# Patient Record
Sex: Male | Born: 1960 | Hispanic: Yes | Marital: Single | Smoking: Former smoker
Health system: Southern US, Community
[De-identification: ages and names within clinical notes are randomized; demographics above are authoritative.]

## PROBLEM LIST (undated history)

## (undated) DIAGNOSIS — E119 Type 2 diabetes mellitus without complications: Secondary | ICD-10-CM

## (undated) HISTORY — PX: ABDOMINAL SURGERY: SHX537

---

## 2005-08-19 ENCOUNTER — Emergency Department (HOSPITAL_COMMUNITY): Admission: EM | Admit: 2005-08-19 | Discharge: 2005-08-19 | Payer: Self-pay | Admitting: Emergency Medicine

## 2006-01-08 ENCOUNTER — Emergency Department (HOSPITAL_COMMUNITY): Admission: EM | Admit: 2006-01-08 | Discharge: 2006-01-08 | Payer: Self-pay | Admitting: Emergency Medicine

## 2007-03-26 ENCOUNTER — Emergency Department (HOSPITAL_COMMUNITY): Admission: EM | Admit: 2007-03-26 | Discharge: 2007-03-26 | Payer: Self-pay | Admitting: Emergency Medicine

## 2007-03-28 ENCOUNTER — Emergency Department (HOSPITAL_COMMUNITY): Admission: EM | Admit: 2007-03-28 | Discharge: 2007-03-28 | Payer: Self-pay | Admitting: *Deleted

## 2007-09-15 ENCOUNTER — Emergency Department (HOSPITAL_COMMUNITY): Admission: EM | Admit: 2007-09-15 | Discharge: 2007-09-15 | Payer: Self-pay | Admitting: Emergency Medicine

## 2007-09-25 ENCOUNTER — Emergency Department (HOSPITAL_COMMUNITY): Admission: EM | Admit: 2007-09-25 | Discharge: 2007-09-25 | Payer: Self-pay | Admitting: Emergency Medicine

## 2007-11-23 ENCOUNTER — Ambulatory Visit: Payer: Self-pay | Admitting: Internal Medicine

## 2007-11-23 ENCOUNTER — Encounter (INDEPENDENT_AMBULATORY_CARE_PROVIDER_SITE_OTHER): Payer: Self-pay | Admitting: Nurse Practitioner

## 2007-11-23 LAB — CONVERTED CEMR LAB
ALT: 24 units/L (ref 0–53)
AST: 14 units/L (ref 0–37)
Alkaline Phosphatase: 94 units/L (ref 39–117)
Basophils Absolute: 0 10*3/uL (ref 0.0–0.1)
Basophils Relative: 0 % (ref 0–1)
Creatinine, Ser: 0.95 mg/dL (ref 0.40–1.50)
Eosinophils Relative: 1 % (ref 0–5)
HCT: 52.3 % — ABNORMAL HIGH (ref 39.0–52.0)
Hemoglobin: 17.9 g/dL — ABNORMAL HIGH (ref 13.0–17.0)
Hgb A1c MFr Bld: 12.5 % — ABNORMAL HIGH (ref 4.6–6.1)
LDL Cholesterol: 114 mg/dL — ABNORMAL HIGH (ref 0–99)
MCHC: 34.2 g/dL (ref 30.0–36.0)
Monocytes Absolute: 0.5 10*3/uL (ref 0.1–1.0)
Platelets: 152 10*3/uL (ref 150–400)
RDW: 13 % (ref 11.5–15.5)
TSH: 1.734 microintl units/mL (ref 0.350–5.50)
Total Bilirubin: 1.5 mg/dL — ABNORMAL HIGH (ref 0.3–1.2)
Total CHOL/HDL Ratio: 2.9
VLDL: 19 mg/dL (ref 0–40)

## 2007-11-24 ENCOUNTER — Ambulatory Visit: Payer: Self-pay | Admitting: *Deleted

## 2008-02-28 ENCOUNTER — Ambulatory Visit: Payer: Self-pay | Admitting: Internal Medicine

## 2008-03-07 ENCOUNTER — Ambulatory Visit: Payer: Self-pay | Admitting: Internal Medicine

## 2011-03-17 ENCOUNTER — Emergency Department (HOSPITAL_COMMUNITY)
Admission: EM | Admit: 2011-03-17 | Discharge: 2011-03-18 | Disposition: A | Discharge status: Home / Self Care | Payer: Self-pay | Attending: Emergency Medicine | Admitting: Emergency Medicine

## 2011-03-17 DIAGNOSIS — X58XXXA Exposure to other specified factors, initial encounter: Secondary | ICD-10-CM | POA: Insufficient documentation

## 2011-03-17 DIAGNOSIS — F17200 Nicotine dependence, unspecified, uncomplicated: Secondary | ICD-10-CM | POA: Insufficient documentation

## 2011-03-17 DIAGNOSIS — S61209A Unspecified open wound of unspecified finger without damage to nail, initial encounter: Secondary | ICD-10-CM | POA: Insufficient documentation

## 2011-03-17 DIAGNOSIS — E119 Type 2 diabetes mellitus without complications: Secondary | ICD-10-CM | POA: Insufficient documentation

## 2011-03-17 DIAGNOSIS — Z79899 Other long term (current) drug therapy: Secondary | ICD-10-CM | POA: Insufficient documentation

## 2011-04-23 LAB — URINALYSIS, ROUTINE W REFLEX MICROSCOPIC
Bilirubin Urine: NEGATIVE
Bilirubin Urine: NEGATIVE
Glucose, UA: >1000 — AB
Glucose, UA: >1000 — AB
Ketones, ur: NEGATIVE
Ketones, ur: NEGATIVE
Leukocytes, UA: NEGATIVE
Leukocytes, UA: NEGATIVE
Protein, ur: NEGATIVE
pH: 7
pH: 6

## 2011-04-23 LAB — I-STAT 8, (EC8 V) (CONVERTED LAB)
Bicarbonate: 25.8 — ABNORMAL HIGH
Chloride: 101
Glucose, Bld: 585
Glucose, Bld: 367 — ABNORMAL HIGH
HCT: 50
Hemoglobin: 17
Operator id: 146091
Potassium: 4.3
Potassium: 3.9
Sodium: 135
TCO2: 27
pH, Ven: 7.36 — ABNORMAL HIGH

## 2011-04-23 LAB — POCT I-STAT CREATININE
Creatinine, Ser: 0.9
Operator id: 146091

## 2011-04-23 LAB — DIFFERENTIAL
Basophils Relative: 1
Lymphocytes Relative: 20
Monocytes Absolute: 0.4
Monocytes Relative: 6
Neutro Abs: 4.8

## 2011-04-23 LAB — CBC
Hemoglobin: 17
MCHC: 34.7
RBC: 5.34

## 2011-04-23 LAB — URINE MICROSCOPIC-ADD ON

## 2012-09-17 ENCOUNTER — Encounter (HOSPITAL_COMMUNITY): Payer: Self-pay | Admitting: Emergency Medicine

## 2012-09-17 ENCOUNTER — Emergency Department (HOSPITAL_COMMUNITY)
Admission: EM | Admit: 2012-09-17 | Discharge: 2012-09-17 | Disposition: A | Discharge status: Home / Self Care | Payer: Self-pay | Attending: Emergency Medicine | Admitting: Emergency Medicine

## 2012-09-17 DIAGNOSIS — Z87891 Personal history of nicotine dependence: Secondary | ICD-10-CM | POA: Insufficient documentation

## 2012-09-17 DIAGNOSIS — R739 Hyperglycemia, unspecified: Secondary | ICD-10-CM

## 2012-09-17 DIAGNOSIS — Z79899 Other long term (current) drug therapy: Secondary | ICD-10-CM | POA: Insufficient documentation

## 2012-09-17 DIAGNOSIS — E1169 Type 2 diabetes mellitus with other specified complication: Secondary | ICD-10-CM | POA: Insufficient documentation

## 2012-09-17 DIAGNOSIS — R631 Polydipsia: Secondary | ICD-10-CM | POA: Insufficient documentation

## 2012-09-17 DIAGNOSIS — R358 Other polyuria: Secondary | ICD-10-CM | POA: Insufficient documentation

## 2012-09-17 DIAGNOSIS — R3589 Other polyuria: Secondary | ICD-10-CM | POA: Insufficient documentation

## 2012-09-17 HISTORY — DX: Type 2 diabetes mellitus without complications: E11.9

## 2012-09-17 LAB — CBC WITH DIFFERENTIAL/PLATELET
Basophils Absolute: 0 10*3/uL (ref 0.0–0.1)
Basophils Relative: 0 % (ref 0–1)
Eosinophils Absolute: 0.1 K/uL (ref 0.0–0.7)
Eosinophils Relative: 2 % (ref 0–5)
HCT: 41.5 % (ref 39.0–52.0)
Hemoglobin: 15.1 g/dL (ref 13.0–17.0)
Lymphocytes Relative: 25 % (ref 12–46)
Lymphs Abs: 1.4 K/uL (ref 0.7–4.0)
MCH: 31.6 pg (ref 26.0–34.0)
MCHC: 36.4 g/dL — ABNORMAL HIGH (ref 30.0–36.0)
MCV: 86.8 fL (ref 78.0–100.0)
Monocytes Absolute: 0.4 10*3/uL (ref 0.1–1.0)
Monocytes Relative: 7 % (ref 3–12)
Neutro Abs: 3.6 10*3/uL (ref 1.7–7.7)
Neutrophils Relative %: 66 % (ref 43–77)
Platelets: 121 10*3/uL — ABNORMAL LOW (ref 150–400)
RBC: 4.78 MIL/uL (ref 4.22–5.81)
RDW: 12.8 % (ref 11.5–15.5)
WBC: 5.5 10*3/uL (ref 4.0–10.5)

## 2012-09-17 LAB — POCT I-STAT, CHEM 8
Creatinine, Ser: 0.9 mg/dL (ref 0.50–1.35)
Glucose, Bld: 570 mg/dL (ref 70–99)
HCT: 44 % (ref 39.0–52.0)
Hemoglobin: 15 g/dL (ref 13.0–17.0)
Potassium: 4.2 mEq/L (ref 3.5–5.1)
Sodium: 133 mEq/L — ABNORMAL LOW (ref 135–145)
TCO2: 24 mmol/L (ref 0–100)

## 2012-09-17 LAB — GLUCOSE, CAPILLARY
Glucose-Capillary: 526 mg/dL — ABNORMAL HIGH (ref 70–99)
Glucose-Capillary: 304 mg/dL — ABNORMAL HIGH (ref 70–99)
Glucose-Capillary: 117 mg/dL — ABNORMAL HIGH (ref 70–99)
Glucose-Capillary: 88 mg/dL (ref 70–99)

## 2012-09-17 MED ORDER — INSULIN ASPART 100 UNIT/ML ~~LOC~~ SOLN: 16.0000 [IU] | Freq: Once | SUBCUTANEOUS | Status: DC

## 2012-09-17 MED ORDER — SODIUM CHLORIDE 0.9 % IV BOLUS (SEPSIS)
1000.0000 mL | Freq: Once | INTRAVENOUS | Status: AC
  Administered 2012-09-17: 1000 mL via INTRAVENOUS

## 2012-09-17 MED ORDER — INSULIN ASPART 100 UNIT/ML ~~LOC~~ SOLN
14.0000 [IU] | Freq: Once | SUBCUTANEOUS | Status: AC
  Administered 2012-09-17: 14 [IU] via SUBCUTANEOUS
  Filled 2012-09-17: qty 1

## 2012-09-17 NOTE — ED Notes (Signed)
CBG was 88. 

## 2012-09-17 NOTE — ED Notes (Signed)
Pt states "my blood sugar is high." Pt c/o hunger/thirst. Denies pain.

## 2012-09-17 NOTE — ED Notes (Signed)
Pt given oj, pb and crackers per peter. Pt tolerating well.

## 2012-09-17 NOTE — ED Provider Notes (Signed)
History     CSN: [id number]  Arrival date & time 09/17/12  1836   First MD Initiated Contact with Patient 09/17/12 2014      Chief Complaint  Patient presents with  . Hyperglycemia   HPI   history provided by the patient. Patient is a 52 year old Hispanic male with history of diabetes currently on metformin who presents with complaints of elevated blood sugar. Patient states that he has used to having normal blood sugars as high as 350. He takes 1000 mg of metformin in the morning and at night has been taking this regularly for the past week. Over the past 2 days he is to some increasing blood sugar levels. She also has symptoms of increased thirst and polyuria. He denies any other complaints. Today his blood sugar was above 500 he was concerned and came to the emergency room for further evaluation. Patient normally follows up with the free local clinic but is not recall the office or doctor. He states he at least one week of medications remaining as well as has one refill of his metformin at the pharmacy. He reports that he has plenty of test strips at home. He denies any recent fever, cough or congestion symptoms. Denies any vomiting or diarrhea.    Past Medical History  Diagnosis Date  . Diabetes mellitus without complication     Past Surgical History  Procedure Laterality Date  . Abdominal surgery      No family history on file.  History  Substance Use Topics  . Smoking status: Former Smoker    Quit date: 07/17/2012  . Smokeless tobacco: Not on file  . Alcohol Use: No      Review of Systems  Constitutional: Negative for fever and chills.  Respiratory: Negative for cough and shortness of breath.   Cardiovascular: Negative for chest pain.  Gastrointestinal: Negative for nausea, vomiting, abdominal pain and diarrhea.  Neurological: Negative for dizziness and light-headedness.  All other systems reviewed and are negative.    Allergies  Review of patient's allergies  indicates no known allergies.  Home Medications   Current Outpatient Rx  Name  Route  Sig  Dispense  Refill  . metFORMIN (GLUCOPHAGE) 1000 MG tablet   Oral   Take 1,000 mg by mouth 2 (two) times daily with a meal.           BP 137/78  Pulse 60  Temp(Src) 98 F (36.7 C)  Resp 18  SpO2 97%  Physical Exam  Nursing note and vitals reviewed. Constitutional: He is oriented to person, place, and time. He appears well-developed and well-nourished. No distress.  HENT:  Head: Normocephalic.  Eyes: Conjunctivae and EOM are normal. Pupils are equal, round, and reactive to light.  Cardiovascular: Normal rate and regular rhythm.   No murmur heard. Pulmonary/Chest: Effort normal and breath sounds normal. No respiratory distress. He has no wheezes. He has no rales.  Abdominal: Soft. There is no tenderness. There is no rebound.  Neurological: He is alert and oriented to person, place, and time.  Skin: Skin is warm.  Psychiatric: He has a normal mood and affect. His behavior is normal.    ED Course  Procedures   Results for orders placed during the hospital encounter of 09/17/12  CBC WITH DIFFERENTIAL      Result Value Range   WBC 5.5  4.0 - 10.5 K/uL   RBC 4.78  4.22 - 5.81 MIL/uL   Hemoglobin 15.1  13.0 - 17.0 g/dL  HCT 41.5  39.0 - 52.0 %   MCV 86.8  78.0 - 100.0 fL   MCH 31.6  26.0 - 34.0 pg   MCHC 36.4 (*) 30.0 - 36.0 g/dL   RDW 16.1  09.6 - 04.5 %   Platelets 121 (*) 150 - 400 K/uL   Neutrophils Relative 66  43 - 77 %   Neutro Abs 3.6  1.7 - 7.7 K/uL   Lymphocytes Relative 25  12 - 46 %   Lymphs Abs 1.4  0.7 - 4.0 K/uL   Monocytes Relative 7  3 - 12 %   Monocytes Absolute 0.4  0.1 - 1.0 K/uL   Eosinophils Relative 2  0 - 5 %   Eosinophils Absolute 0.1  0.0 - 0.7 K/uL   Basophils Relative 0  0 - 1 %   Basophils Absolute 0.0  0.0 - 0.1 K/uL  GLUCOSE, CAPILLARY      Result Value Range   Glucose-Capillary 526 (*) 70 - 99 mg/dL   Comment 1 Notify RN    GLUCOSE,  CAPILLARY      Result Value Range   Glucose-Capillary 304 (*) 70 - 99 mg/dL  GLUCOSE, CAPILLARY      Result Value Range   Glucose-Capillary 117 (*) 70 - 99 mg/dL  GLUCOSE, CAPILLARY      Result Value Range   Glucose-Capillary 88  70 - 99 mg/dL  POCT I-STAT, CHEM 8      Result Value Range   Sodium 133 (*) 135 - 145 mEq/L   Potassium 4.2  3.5 - 5.1 mEq/L   Chloride 99  96 - 112 mEq/L   BUN 16  6 - 23 mg/dL   Creatinine, Ser 4.09  0.50 - 1.35 mg/dL   Glucose, Bld 811 (*) 70 - 99 mg/dL   Calcium, Ion 9.14  7.82 - 1.23 mmol/L   TCO2 24  0 - 100 mmol/L   Hemoglobin 15.0  13.0 - 17.0 g/dL   HCT 95.6  21.3 - 08.6 %   Comment NOTIFIED PHYSICIAN    '      1. Hyperglycemia       MDM  8:15 PM patient seen and evaluated. Patient resting comfortably in bed appears in no acute distress.   Blood sugar has improved after subcutaneous insulin and initial IV fluids. Will continue IV fluids and monitor sugar level. No signs of DKA on lab tests.  Blood sugars continues to improve. At this time patient stable for discharge home. We'll have him continue current medication regimen and followup with PCP for further evaluation.      Kevin Hubbard, [place] 09/18/12 [phone]

## 2012-09-17 NOTE — ED Notes (Addendum)
Pt checked Blood sugar this morning it was 352. Pt ate fruit all day today and had [employer] this evening and re-checked blood sugar read 510. Pt took 1 metformin 1000mg  tab an hour PTA. Pt reports increase thirst and urination.

## 2012-09-17 NOTE — ED Notes (Signed)
CBG was 117 

## 2012-09-20 NOTE — ED Provider Notes (Signed)
Medical screening examination/treatment/procedure(s) were performed by non-physician practitioner and as supervising physician I was immediately available for consultation/collaboration.   Gavin Pound. Teya Otterson, MD 09/20/12 1126

## 2013-07-25 ENCOUNTER — Emergency Department (HOSPITAL_COMMUNITY)
Admission: EM | Admit: 2013-07-25 | Discharge: 2013-07-25 | Disposition: A | Discharge status: Home / Self Care | Payer: Self-pay | Attending: Emergency Medicine | Admitting: Emergency Medicine

## 2013-07-25 ENCOUNTER — Encounter (HOSPITAL_COMMUNITY): Payer: Self-pay | Admitting: Emergency Medicine

## 2013-07-25 DIAGNOSIS — Z760 Encounter for issue of repeat prescription: Secondary | ICD-10-CM | POA: Insufficient documentation

## 2013-07-25 DIAGNOSIS — R42 Dizziness and giddiness: Secondary | ICD-10-CM | POA: Insufficient documentation

## 2013-07-25 DIAGNOSIS — E119 Type 2 diabetes mellitus without complications: Secondary | ICD-10-CM | POA: Insufficient documentation

## 2013-07-25 DIAGNOSIS — Z87891 Personal history of nicotine dependence: Secondary | ICD-10-CM | POA: Insufficient documentation

## 2013-07-25 DIAGNOSIS — R739 Hyperglycemia, unspecified: Secondary | ICD-10-CM

## 2013-07-25 DIAGNOSIS — R350 Frequency of micturition: Secondary | ICD-10-CM | POA: Insufficient documentation

## 2013-07-25 LAB — URINE MICROSCOPIC-ADD ON

## 2013-07-25 LAB — POCT I-STAT, CHEM 8
BUN: 16 mg/dL (ref 6–23)
Chloride: 101 mEq/L (ref 96–112)
HCT: 44 % (ref 39.0–52.0)
Potassium: 3.8 mEq/L (ref 3.5–5.1)

## 2013-07-25 LAB — URINALYSIS, ROUTINE W REFLEX MICROSCOPIC
Glucose, UA: >1000 mg/dL — AB
Leukocytes, UA: NEGATIVE
Nitrite: NEGATIVE
Specific Gravity, Urine: 1.036 — ABNORMAL HIGH (ref 1.005–1.030)
pH: 6 (ref 5.0–8.0)

## 2013-07-25 LAB — GLUCOSE, CAPILLARY

## 2013-07-25 MED ORDER — METFORMIN HCL 1000 MG PO TABS: 1000.0000 mg | ORAL_TABLET | Freq: Two times a day (BID) | ORAL | Status: DC

## 2013-07-25 MED ORDER — GLIPIZIDE 10 MG PO TABS: 10.0000 mg | ORAL_TABLET | Freq: Every day | ORAL | Status: DC

## 2013-07-25 NOTE — ED Notes (Signed)
Pt here to get refill on medication; stated that he feels fine just knows that he needs to get medication filled or his blood sugar will rise

## 2013-07-25 NOTE — ED Notes (Addendum)
Reports being out of diabetes medications x 1 month. cbg 300

## 2013-07-25 NOTE — ED Provider Notes (Signed)
Medical screening examination/treatment/procedure(s) were performed by non-physician practitioner and as supervising physician I was immediately available for consultation/collaboration.  EKG Interpretation   None         Derwood Kaplan, MD 07/25/13 2126

## 2013-07-25 NOTE — ED Provider Notes (Signed)
CSN: [id number]     Arrival date & time 07/25/13  1109 History   None    Chief Complaint  Patient presents with  . Medication Refill    HPI  Kevin Hubbard is a 52 y.o. male with a PMH of DM who presents to the ED for evaluation of medication refill.  History was provided by the patient.  Patient states that he has been out of his diabetes medications x 1 month.  He states that he has put in requests for refill by his PCP Dr. Kris Hartmann however "he must be out of town" because he has not heard back from them.  He takes glipizide 10 mg once daily and Metformin 1000 mg twice daily which controls his sugars in the 120-150's.  He states that since he has been off of his medications his blood sugar has been in the 300-450's.  He is otherwise asymptomatic.  He notices that he gets a little dizzy and has increased urination when his sugars are high, but once they do down he feels fine.  He denies any fevers, chest pain, SOB, emesis, diarrhea, headache, weakness, or abdominal pain.      Past Medical History  Diagnosis Date  . Diabetes mellitus without complication    Past Surgical History  Procedure Laterality Date  . Abdominal surgery     History reviewed. No pertinent family history. History  Substance Use Topics  . Smoking status: Former Smoker    Quit date: 07/17/2012  . Smokeless tobacco: Not on file  . Alcohol Use: No    Review of Systems  Constitutional: Negative for fever, chills, diaphoresis, activity change, appetite change and fatigue.  Respiratory: Negative for cough and shortness of breath.   Cardiovascular: Negative for chest pain and leg swelling.  Gastrointestinal: Negative for nausea, vomiting, abdominal pain and diarrhea.  Endocrine: Positive for polyuria (intermittent).  Genitourinary: Negative for dysuria.  Musculoskeletal: Negative for back pain, myalgias and neck pain.  Skin: Negative for color change and wound.  Neurological: Positive for dizziness (intermittent).  Negative for syncope, weakness, light-headedness, numbness and headaches.  Psychiatric/Behavioral: Negative for confusion.    Allergies  Review of patient's allergies indicates no known allergies.  Home Medications   Current Outpatient Rx  Name  Route  Sig  Dispense  Refill  . metFORMIN (GLUCOPHAGE) 1000 MG tablet   Oral   Take 1,000 mg by mouth 2 (two) times daily with a meal.          BP 129/84  Pulse 67  Temp(Src) 97.6 F (36.4 C) (Oral)  Resp 16  Ht 5\' 3"  (1.6 m)  Wt 152 lb 4 oz (69.06 kg)  BMI 26.98 kg/m2  SpO2 100%  Filed Vitals:   07/25/13 1149 07/25/13 1200 07/25/13 1215 07/25/13 1230  BP: 110/57 109/62 157/70 109/64  Pulse: 69 61 71 59  Temp:      TempSrc:      Resp: 18     Height:      Weight:      SpO2: 97% 96% 95% 96%    Physical Exam  Nursing note and vitals reviewed. Constitutional: He is oriented to person, place, and time. He appears well-developed and well-nourished. No distress.  HENT:  Head: Normocephalic and atraumatic.  Right Ear: External ear normal.  Left Ear: External ear normal.  Nose: Nose normal.  Mouth/Throat: Oropharynx is clear and moist.  Eyes: Conjunctivae are normal. Pupils are equal, round, and reactive to light. Right eye exhibits no discharge.  Left eye exhibits no discharge.  Neck: Normal range of motion. Neck supple.  Cardiovascular: Normal rate, regular rhythm, normal heart sounds and intact distal pulses.  Exam reveals no gallop and no friction rub.   No murmur heard. Dorsalis pedis pulses present and equal bilaterally.  Pulmonary/Chest: Effort normal and breath sounds normal. No respiratory distress. He has no wheezes. He has no rales. He exhibits no tenderness.  Abdominal: Soft. He exhibits no distension and no mass. There is no tenderness. There is no rebound and no guarding.  Vertical surgical scar   Musculoskeletal: Normal range of motion. He exhibits no edema and no tenderness.  No pedal edema or calf tenderness  bilaterally.  Neurological: He is alert and oriented to person, place, and time.  Skin: Skin is warm and dry. He is not diaphoretic.    ED Course  Procedures (including critical care time) Labs Review Labs Reviewed  GLUCOSE, CAPILLARY - Abnormal; Notable for the following:    Glucose-Capillary 300 (*)    All other components within normal limits   Imaging Review No results found.  EKG Interpretation   None      Results for orders placed during the hospital encounter of 07/25/13  GLUCOSE, CAPILLARY      Result Value Range   Glucose-Capillary 300 (*) 70 - 99 mg/dL   Comment 1 Notify RN     Results for orders placed during the hospital encounter of 07/25/13  GLUCOSE, CAPILLARY      Result Value Range   Glucose-Capillary 300 (*) 70 - 99 mg/dL   Comment 1 Notify RN    URINALYSIS, ROUTINE W REFLEX MICROSCOPIC      Result Value Range   Color, Urine YELLOW  YELLOW   APPearance CLEAR  CLEAR   Specific Gravity, Urine 1.036 (*) 1.005 - 1.030   pH 6.0  5.0 - 8.0   Glucose, UA >1000 (*) NEGATIVE mg/dL   Hgb urine dipstick NEGATIVE  NEGATIVE   Bilirubin Urine NEGATIVE  NEGATIVE   Ketones, ur NEGATIVE  NEGATIVE mg/dL   Protein, ur NEGATIVE  NEGATIVE mg/dL   Urobilinogen, UA 1.0  0.0 - 1.0 mg/dL   Nitrite NEGATIVE  NEGATIVE   Leukocytes, UA NEGATIVE  NEGATIVE  URINE MICROSCOPIC-ADD ON      Result Value Range   Squamous Epithelial / LPF RARE  RARE   WBC, UA 0-2  <3 WBC/hpf  POCT I-STAT, CHEM 8      Result Value Range   Sodium 139  135 - 145 mEq/L   Potassium 3.8  3.5 - 5.1 mEq/L   Chloride 101  96 - 112 mEq/L   BUN 16  6 - 23 mg/dL   Creatinine, Ser 0.45  0.50 - 1.35 mg/dL   Glucose, Bld 409 (*) 70 - 99 mg/dL   Calcium, Ion 8.11 (*) 1.12 - 1.23 mmol/L   TCO2 24  0 - 100 mmol/L   Hemoglobin 15.0  13.0 - 17.0 g/dL   HCT 91.4  78.2 - 95.6 %    MDM   Kevin Hubbard is a 52 y.o. male with a PMH of DM who presents to the ED for evaluation of medication refill.    Rechecks   1:40 PM = Patient resting comfortably.  No complaints.     Patient in ED for medication refill of his metformin and glipizide.  Has been out of medications x 1 month.  AG 14.  No ketones in the urine.  No UTI.  Benign exam.  Patient asymptomatic.  Patient's medications were refilled.  Patient encouraged to follow-up with his PCP for further evaluation and management.  Return precautions, discharge instructions, and follow-up was discussed with the patient before discharge.      New Prescriptions   GLIPIZIDE (GLUCOTROL) 10 MG TABLET    Take 1 tablet (10 mg total) by mouth daily before breakfast.   METFORMIN (GLUCOPHAGE) 1000 MG TABLET    Take 1 tablet (1,000 mg total) by mouth 2 (two) times daily with a meal.    Final impressions: 1. Encounter for medication refill   2. Hyperglycemia       Luiz Iron PA-C   This patient was discussed with Dr. Verne Carrow, PA-C 07/25/13 1347

## 2014-03-09 ENCOUNTER — Emergency Department (HOSPITAL_COMMUNITY)
Admission: EM | Admit: 2014-03-09 | Discharge: 2014-03-09 | Disposition: A | Discharge status: Home / Self Care | Payer: Self-pay | Attending: Emergency Medicine | Admitting: Emergency Medicine

## 2014-03-09 ENCOUNTER — Encounter (HOSPITAL_COMMUNITY): Payer: Self-pay | Admitting: Emergency Medicine

## 2014-03-09 DIAGNOSIS — M79609 Pain in unspecified limb: Secondary | ICD-10-CM

## 2014-03-09 DIAGNOSIS — M79604 Pain in right leg: Secondary | ICD-10-CM

## 2014-03-09 DIAGNOSIS — Z87891 Personal history of nicotine dependence: Secondary | ICD-10-CM | POA: Insufficient documentation

## 2014-03-09 DIAGNOSIS — E119 Type 2 diabetes mellitus without complications: Secondary | ICD-10-CM | POA: Insufficient documentation

## 2014-03-09 DIAGNOSIS — Z79899 Other long term (current) drug therapy: Secondary | ICD-10-CM | POA: Insufficient documentation

## 2014-03-09 DIAGNOSIS — M549 Dorsalgia, unspecified: Secondary | ICD-10-CM | POA: Insufficient documentation

## 2014-03-09 LAB — CBG MONITORING, ED: GLUCOSE-CAPILLARY: 258 mg/dL — AB (ref 70–99)

## 2014-03-09 MED ORDER — METFORMIN HCL 1000 MG PO TABS: 1000.0000 mg | ORAL_TABLET | Freq: Two times a day (BID) | ORAL | Status: DC

## 2014-03-09 NOTE — ED Notes (Signed)
PA Harris at bedside  

## 2014-03-09 NOTE — Progress Notes (Signed)
*  PRELIMINARY RESULTS* Vascular Ultrasound Right lower extremity venous duplex has been completed.  Preliminary findings: No evidence of DVT, superficial thrombosis, or baker's cyst.  Farrel DemarkJill Eunice, RDMS, RVT  03/09/2014, 1:54 PM

## 2014-03-09 NOTE — ED Notes (Signed)
Rt. Leg pain began yesterday denies any injury, spasm like pain shoots down his thigh into the foot. No swelling or deformity noted. +pedal pulse +CNS

## 2014-03-09 NOTE — ED Provider Notes (Signed)
CSN: [id number]     Arrival date & time 03/09/14  4098 History   First MD Initiated Contact with Patient 03/09/14 [phone]     Chief Complaint  Patient presents with  . Leg Pain     (Consider location/radiation/quality/duration/timing/severity/associated sxs/prior Treatment) HPI Comments: Kevin Hubbard This 53 year old Spanish-speaking male who presents with complaint of right leg pain. Patient states it began yesterday morning. He describes the pain as aching, from his right knee down to his right foot. He states that he thought that his leg was full of blood. He rested yesterday and his leg above his heart which seemed to help some. He states it in the middle of the night the pain was so severe that it woke him from sleep. He did not take anything for the pain. He denies any radiation, numbness, tingling. Patient states that he is a diabetic and he was concerned there was a problem with his leg. He denies a history of neuropathic symptoms.  Patient is a 53 y.o. male presenting with leg pain. The history is provided by the patient. The history is limited by a language barrier. A language interpreter was used.  Leg Pain Location:  Leg Time since incident:  1 day Injury: no   Leg location:  R leg Pain details:    Quality:  Aching   Radiates to:  Does not radiate   Pain severity now: no pain currently.   Onset quality:  Gradual   Duration:  24 hours   Timing:  Constant   Progression:  Resolved Chronicity:  New Prior injury to area:  No Relieved by:  None tried Worsened by:  Nothing tried Associated symptoms: back pain   Associated symptoms: no decreased ROM, no fatigue, no fever, no itching, no muscle weakness, no neck pain, no numbness, no stiffness and no swelling     Past Medical History  Diagnosis Date  . Diabetes mellitus without complication    Past Surgical History  Procedure Laterality Date  . Abdominal surgery     No family history on file. History  Substance Use  Topics  . Smoking status: Former Smoker    Quit date: 07/17/2012  . Smokeless tobacco: Not on file  . Alcohol Use: No    Review of Systems  Constitutional: Negative for fever, chills and fatigue.  Respiratory: Negative for cough and shortness of breath.   Cardiovascular: Negative for chest pain and palpitations.  Gastrointestinal: Negative for vomiting, abdominal pain, diarrhea and constipation.  Genitourinary: Negative for dysuria, urgency and frequency.  Musculoskeletal: Positive for back pain. Negative for arthralgias, myalgias, neck pain and stiffness.  Skin: Negative for itching and rash.  Neurological: Negative for weakness and numbness.  All other systems reviewed and are negative.     Allergies  Review of patient's allergies indicates no known allergies.  Home Medications   Prior to Admission medications   Medication Sig Start Date End Date Taking? Authorizing Provider  metFORMIN (GLUCOPHAGE) 1000 MG tablet Take 1,000 mg by mouth 2 (two) times daily with a meal.   Yes Historical Provider, MD   BP 134/75  Pulse 70  Temp(Src) 97.7 F (36.5 C) (Oral)  Resp 18  Ht 5\' 3"  (1.6 m)  Wt 145 lb (65.772 kg)  BMI 25.69 kg/m2  SpO2 99% Physical Exam  Nursing note and vitals reviewed. Constitutional: He appears well-developed and well-nourished. No distress.  HENT:  Head: Normocephalic and atraumatic.  Eyes: Conjunctivae are normal. No scleral icterus.  Neck: Normal range of  motion. Neck supple.  Cardiovascular: Normal rate, regular rhythm, normal heart sounds and intact distal pulses.   Pulmonary/Chest: Effort normal and breath sounds normal. No respiratory distress.  Abdominal: Soft. There is no tenderness.  Musculoskeletal: He exhibits no edema.  R leg with good distal pulses  Neurological: He is alert.  Skin: Skin is warm and dry. He is not diaphoretic.  Psychiatric: His behavior is normal.    ED Course  Procedures (including critical care time) Labs  Review Labs Reviewed - No data to display  Imaging Review No results found.   EKG Interpretation None      MDM   Final diagnoses:  Right leg pain    Patient with R leg pain. Will evaluate for DVT. Pulses present. nv intact.     Duplex negative. Will d/c patient. Refilled his diabetes meds. otc meds for pain relief. Discussed return precautions. Pain is resolved currently.    Arthor CaptainAbigail Barron Vanloan, PA-C 03/09/14 2227

## 2014-03-09 NOTE — ED Notes (Signed)
Pt states R lower leg pain shooting to foot. Onset yesterday. 5/10 pain at the time. Pedal pulses present. Sensation intact. No obvious deformity noted. Pt is alert and oriented x4. Ambulatory to exam room.

## 2014-03-09 NOTE — Discharge Instructions (Signed)
Your ultrasound showed no evidence of a clot or other problem with circulation.  You may have a problem with your nerve known as sciatica. Please take over the counter pain medication such as tylenol or advil as needed. Sciatica Sciatica is pain, weakness, numbness, or tingling along the path of the sciatic nerve. The nerve starts in the lower back and runs down the back of each leg. The nerve controls the muscles in the lower leg and in the back of the knee, while also providing sensation to the back of the thigh, lower leg, and the sole of your foot. Sciatica is a symptom of another medical condition. For instance, nerve damage or certain conditions, such as a herniated disk or bone spur on the spine, pinch or put pressure on the sciatic nerve. This causes the pain, weakness, or other sensations normally associated with sciatica. Generally, sciatica only affects one side of the body. CAUSES   Herniated or slipped disc.  Degenerative disk disease.  A pain disorder involving the narrow muscle in the buttocks (piriformis syndrome).  Pelvic injury or fracture.  Pregnancy.  Tumor (rare). SYMPTOMS  Symptoms can vary from mild to very severe. The symptoms usually travel from the low back to the buttocks and down the back of the leg. Symptoms can include:  Mild tingling or dull aches in the lower back, leg, or hip.  Numbness in the back of the calf or sole of the foot.  Burning sensations in the lower back, leg, or hip.  Sharp pains in the lower back, leg, or hip.  Leg weakness.  Severe back pain inhibiting movement. These symptoms may get worse with coughing, sneezing, laughing, or prolonged sitting or standing. Also, being overweight may worsen symptoms. DIAGNOSIS  Your caregiver will perform a physical exam to look for common symptoms of sciatica. He or she may ask you to do certain movements or activities that would trigger sciatic nerve pain. Other tests may be performed to find the  cause of the sciatica. These may include:  Blood tests.  X-rays.  Imaging tests, such as an MRI or CT scan. TREATMENT  Treatment is directed at the cause of the sciatic pain. Sometimes, treatment is not necessary and the pain and discomfort goes away on its own. If treatment is needed, your caregiver may suggest:  Over-the-counter medicines to relieve pain.  Prescription medicines, such as anti-inflammatory medicine, muscle relaxants, or narcotics.  Applying heat or ice to the painful area.  Steroid injections to lessen pain, irritation, and inflammation around the nerve.  Reducing activity during periods of pain.  Exercising and stretching to strengthen your abdomen and improve flexibility of your spine. Your caregiver may suggest losing weight if the extra weight makes the back pain worse.  Physical therapy.  Surgery to eliminate what is pressing or pinching the nerve, such as a bone spur or part of a herniated disk. HOME CARE INSTRUCTIONS   Only take over-the-counter or prescription medicines for pain or discomfort as directed by your caregiver.  Apply ice to the affected area for 20 minutes, 3-4 times a day for the first 48-72 hours. Then try heat in the same way.  Exercise, stretch, or perform your usual activities if these do not aggravate your pain.  Attend physical therapy sessions as directed by your caregiver.  Keep all follow-up appointments as directed by your caregiver.  Do not wear high heels or shoes that do not provide proper support.  Check your mattress to see if it  is too soft. A firm mattress may lessen your pain and discomfort. SEEK IMMEDIATE MEDICAL CARE IF:   You lose control of your bowel or bladder (incontinence).  You have increasing weakness in the lower back, pelvis, buttocks, or legs.  You have redness or swelling of your back.  You have a burning sensation when you urinate.  You have pain that gets worse when you lie down or awakens you  at night.  Your pain is worse than you have experienced in the past.  Your pain is lasting longer than 4 weeks.  You are suddenly losing weight without reason. MAKE SURE YOU:  Understand these instructions.  Will watch your condition.  Will get help right away if you are not doing well or get worse. Document Released: 07/13/2001 Document Revised: 01/18/2012 Document Reviewed: 11/28/2011 [facility] Patient Information 2015 [employer] [place]. This information is not intended to replace advice given to you by your health care provider. Make sure you discuss any questions you have with your health care provider.

## 2014-03-09 NOTE — Progress Notes (Signed)
ED CM spoke with patient at bedside. [facility] c/o [facility] pain, patient also noted to have elevated BS 258 on ED eval. Pt reports not having a PCP or health coverage. [facility] and their services for establishing care,  patient is agreeable. [facility]ided [facility] with address,phone and directions to clinic. Offered to assist with scheduling walk in appt. For Tuesday 8/11 patient agreeable and appreciative of assistance. Pt verbalized understanding,teach back done.  Updated with Dr. Jodi MourningZavitz and Aundra MilletMegan RN on [facility]. No further CM needs identified.

## 2014-03-09 NOTE — ED Notes (Signed)
Report received from [place]egan, [place]-- pt moved to Rm C28

## 2014-03-09 NOTE — ED Notes (Signed)
Pt moved to POD C Room 28, to have doppler study performed, pt aware of delay. No signs of distress noted. Pt remains alert and oriented x4. NAD.

## 2014-03-10 NOTE — ED Provider Notes (Signed)
Medical screening examination/treatment/procedure(s) were conducted as a shared visit with non-physician practitioner(s) or resident and myself. I personally evaluated the patient during the encounter and agree with the findings.  I have personally reviewed any xrays and/ or EKG's with the provider and I agree with interpretation.  Patient with right posterior leg pain worse in the calf she is down right leg, no injuries or history of similar. No back pain. On exam patient has 2+ distal pulses good cap refill, mild tenderness posterior knee and calf, no obvious swelling, sensation intact. Patient denies shortness of breath or chest pain. Plan for ultrasound to look for blood clot or at other differential includes musculoskeletal, sciatica. Patient has good strength lower extremities and good sensation lower extremity.   Right leg pain  Enid SkeensJoshua M Raekwan Spelman, MD 03/10/14 [phone]

## 2015-02-09 ENCOUNTER — Emergency Department (HOSPITAL_COMMUNITY)
Admission: EM | Admit: 2015-02-09 | Discharge: 2015-02-09 | Disposition: A | Discharge status: Home / Self Care | Payer: Self-pay | Attending: Emergency Medicine | Admitting: Emergency Medicine

## 2015-02-09 ENCOUNTER — Encounter (HOSPITAL_COMMUNITY): Payer: Self-pay | Admitting: Emergency Medicine

## 2015-02-09 DIAGNOSIS — Y990 Civilian activity done for income or pay: Secondary | ICD-10-CM | POA: Insufficient documentation

## 2015-02-09 DIAGNOSIS — Z79899 Other long term (current) drug therapy: Secondary | ICD-10-CM | POA: Insufficient documentation

## 2015-02-09 DIAGNOSIS — T148XXA Other injury of unspecified body region, initial encounter: Secondary | ICD-10-CM

## 2015-02-09 DIAGNOSIS — Y929 Unspecified place or not applicable: Secondary | ICD-10-CM | POA: Insufficient documentation

## 2015-02-09 DIAGNOSIS — Z87891 Personal history of nicotine dependence: Secondary | ICD-10-CM | POA: Insufficient documentation

## 2015-02-09 DIAGNOSIS — S80812A Abrasion, left lower leg, initial encounter: Secondary | ICD-10-CM | POA: Insufficient documentation

## 2015-02-09 DIAGNOSIS — E119 Type 2 diabetes mellitus without complications: Secondary | ICD-10-CM | POA: Insufficient documentation

## 2015-02-09 DIAGNOSIS — W1839XA Other fall on same level, initial encounter: Secondary | ICD-10-CM | POA: Insufficient documentation

## 2015-02-09 DIAGNOSIS — Y939 Activity, unspecified: Secondary | ICD-10-CM | POA: Insufficient documentation

## 2015-02-09 MED ORDER — BACITRACIN ZINC 500 UNIT/GM EX OINT: 1.0000 "application " | TOPICAL_OINTMENT | Freq: Two times a day (BID) | CUTANEOUS | Status: DC

## 2015-02-09 NOTE — ED Notes (Signed)
The patient said he fell at work and he got an abrasion on his left shin.  Bleeding is controlled.  This happened on Friday but since he is a diabetic he is worried it might get infected.   Patient rates his 5/10.

## 2015-02-09 NOTE — ED Notes (Signed)
Pt stable, ambulatory, states understanding of discharge instructions 

## 2015-02-09 NOTE — ED Provider Notes (Signed)
CSN: [id number]     Arrival date & time 02/09/15  2031 History  This chart was scribed for Roxy Horseman, PA-C, working with Lorre Nick, MD by Elon Spanner, ED Scribe. This patient was seen in room TR08C/TR08C and the patient's care was started at 9:23 PM.   Chief Complaint  Patient presents with  . Abrasion    The patient said he fell at work and he got an abrasion on his left shin.  Bleeding is controlled.  This happened on Friday but since he is a diabetic he is worried it might get infected.    The history is provided by the patient. No language interpreter was used.   HPI Comments: Kevin Hubbard is a 54 y.o. male with a history of DM who presents to the Emergency Department complaining of a non-painful abrasion on the left leg onset 2 days ago.  The patient has kept the wound clean and used neosporin.  He reports his primary motivator for coming to the ED is concern of infection to his DM.  Tetanus UTD.   Past Medical History  Diagnosis Date  . Diabetes mellitus without complication    Past Surgical History  Procedure Laterality Date  . Abdominal surgery     History reviewed. No pertinent family history. History  Substance Use Topics  . Smoking status: Former Smoker    Quit date: 07/17/2012  . Smokeless tobacco: Not on file  . Alcohol Use: No    Review of Systems  Constitutional: Negative for fever and chills.  Respiratory: Negative for shortness of breath.   Cardiovascular: Negative for chest pain.  Gastrointestinal: Negative for nausea, vomiting, diarrhea and constipation.  Genitourinary: Negative for dysuria.  Skin: Positive for wound.      Allergies  Review of patient's allergies indicates no known allergies.  Home Medications   Prior to Admission medications   Medication Sig Start Date End Date Taking? Authorizing Provider  metFORMIN (GLUCOPHAGE) 1000 MG tablet Take 1 tablet (1,000 mg total) by mouth 2 (two) times daily with a meal. 03/09/14   Abigail  Harris, PA-C   BP 120/67 mmHg  Pulse 58  Temp(Src) 98 F (36.7 C) (Oral)  Resp 18  SpO2 100% Physical Exam  Constitutional: He is oriented to person, place, and time. He appears well-developed and well-nourished. No distress.  HENT:  Head: Normocephalic and atraumatic.  Eyes: Conjunctivae and EOM are normal.  Neck: Neck supple. No tracheal deviation present.  Cardiovascular: Normal rate.   Pulmonary/Chest: Effort normal. No respiratory distress.  Musculoskeletal: Normal range of motion.  Neurological: He is alert and oriented to person, place, and time.  Skin: Skin is warm and dry.  1 x 2 cm abrasion, no discharge, no drainage, no abscess, no cellulitis  Psychiatric: He has a normal mood and affect. His behavior is normal.  Nursing note and vitals reviewed.   ED Course  Procedures (including critical care time)  DIAGNOSTIC STUDIES: Oxygen Saturation is 100% on RA, normal by my interpretation.    COORDINATION OF CARE:  9:24 PM Informed patient that wound appears to be healing well and should be cleaned daily with soap and warm water.  Will prescribe bacitracin.  Patient acknowledges and agrees with plan.    Labs Review Labs Reviewed - No data to display  Imaging Review No results found.   EKG Interpretation None      MDM   Final diagnoses:  Abrasion    Patient with abrasion to left shin, the abrasion/laceration is  too old to repair. There is no evidence of infection. No cellulitis or abscess. Tetanus shot is up-to-date. Will treat with bacitracin and wound care precautions. Patient understands and agrees with the plan. He is stable and ready for discharge.  I personally performed the services described in this documentation, which was scribed in my presence. The recorded information has been reviewed and is accurate.     Roxy HorsemanRobert Belanna Manring, PA-C 02/09/15 2147  Lorre NickAnthony Allen, MD 02/09/15 [phone]

## 2015-02-09 NOTE — ED Notes (Signed)
Wound cleaned and bactrim placed on wound. Covered with 2x2 and tape.

## 2015-02-09 NOTE — Discharge Instructions (Signed)

## 2015-02-18 ENCOUNTER — Emergency Department (HOSPITAL_COMMUNITY)
Admission: EM | Admit: 2015-02-18 | Discharge: 2015-02-18 | Disposition: A | Discharge status: Home / Self Care | Payer: Self-pay | Attending: Emergency Medicine | Admitting: Emergency Medicine

## 2015-02-18 ENCOUNTER — Encounter (HOSPITAL_COMMUNITY): Payer: Self-pay | Admitting: Neurology

## 2015-02-18 DIAGNOSIS — Z87891 Personal history of nicotine dependence: Secondary | ICD-10-CM | POA: Insufficient documentation

## 2015-02-18 DIAGNOSIS — S81812S Laceration without foreign body, left lower leg, sequela: Secondary | ICD-10-CM | POA: Insufficient documentation

## 2015-02-18 DIAGNOSIS — S81802S Unspecified open wound, left lower leg, sequela: Secondary | ICD-10-CM

## 2015-02-18 DIAGNOSIS — Z79899 Other long term (current) drug therapy: Secondary | ICD-10-CM | POA: Insufficient documentation

## 2015-02-18 DIAGNOSIS — Z23 Encounter for immunization: Secondary | ICD-10-CM | POA: Insufficient documentation

## 2015-02-18 DIAGNOSIS — E119 Type 2 diabetes mellitus without complications: Secondary | ICD-10-CM | POA: Insufficient documentation

## 2015-02-18 DIAGNOSIS — W2203XS Walked into furniture, sequela: Secondary | ICD-10-CM | POA: Insufficient documentation

## 2015-02-18 MED ORDER — CEPHALEXIN 500 MG PO CAPS: 1000.0000 mg | ORAL_CAPSULE | Freq: Two times a day (BID) | ORAL | Status: DC

## 2015-02-18 MED ORDER — SULFAMETHOXAZOLE-TRIMETHOPRIM 800-160 MG PO TABS: 1.0000 | ORAL_TABLET | Freq: Two times a day (BID) | ORAL | Status: AC

## 2015-02-18 MED ORDER — TETANUS-DIPHTH-ACELL PERTUSSIS 5-2.5-18.5 LF-MCG/0.5 IM SUSP
0.5000 mL | Freq: Once | INTRAMUSCULAR | Status: AC
Start: 2015-02-18 — End: 2015-02-18
  Administered 2015-02-18: 0.5 mL via INTRAMUSCULAR
  Filled 2015-02-18: qty 0.5

## 2015-02-18 NOTE — ED Provider Notes (Signed)
CSN: [id number]     Arrival date & time 02/18/15  1421 History  This chart was scribed for Fayrene Helper, PA-C, working with Toy Cookey, MD by Octavia Heir, ED Scribe. This patient was seen in room TR05C/TR05C and the patient's care was started at 3:17 PM.    Chief Complaint  Patient presents with  . Wound Check      The history is provided by the patient. No language interpreter was used.   HPI Comments: Kevin Hubbard is a 54 y.o. male who has a hx of DM presents to the Emergency Department complaining of a wound on his left shin onset 2 weeks ago. Pt works in [employer] and cut his leg with a piece of wood causing a laceration. He notes occasional pain around the area. Pt notes 2 days ago, his leg was swollen from his leg down to his ankle. Pt was seen last week in the ED, evaluated and was sent home with no medication. He states using neosporin for the wound and has been changing the bandages regularly. He denies fever. He is unknown of his last tetanus shot. He is concern due to hx of DM and not receiving any treatment.    Past Medical History  Diagnosis Date  . Diabetes mellitus without complication    Past Surgical History  Procedure Laterality Date  . Abdominal surgery     No family history on file. History  Substance Use Topics  . Smoking status: Former Smoker    Quit date: 07/17/2012  . Smokeless tobacco: Not on file  . Alcohol Use: No    Review of Systems  Constitutional: Negative for fever.  Skin: Positive for wound.      Allergies  Review of patient's allergies indicates no known allergies.  Home Medications   Prior to Admission medications   Medication Sig Start Date End Date Taking? Authorizing Provider  bacitracin ointment Apply 1 application topically 2 (two) times daily. 02/09/15   Roxy Horseman, PA-C  metFORMIN (GLUCOPHAGE) 1000 MG tablet Take 1 tablet (1,000 mg total) by mouth 2 (two) times daily with a meal. 03/09/14   Arthor Captain, PA-C    Triage vitals: BP 123/70 mmHg  Pulse 72  Temp(Src) 97.8 F (36.6 C) (Oral)  Resp 16  SpO2 96% Physical Exam  Constitutional: He appears well-developed and well-nourished. No distress.  HENT:  Head: Normocephalic and atraumatic.  Eyes: Conjunctivae are normal. Pupils are equal, round, and reactive to light. Right eye exhibits no discharge. Left eye exhibits no discharge.  Neck: Neck supple.  Cardiovascular: Normal rate, regular rhythm, normal heart sounds and intact distal pulses.   Pulmonary/Chest: Effort normal and breath sounds normal. No respiratory distress.  Abdominal: Soft. There is no tenderness.  Lymphadenopathy:    He has no cervical adenopathy.  Neurological: He is alert. Coordination normal.  Skin: Skin is warm and dry. No rash noted. He is not diaphoretic.  Quarter sized partial thickness ulceration to left anterior tib-fib with granular tissue and mild denuded skin without lymphangitis or abscess.  Leg compartment is soft.  Pedal pulse intact, brisk cap refill.    Psychiatric: He has a normal mood and affect. His behavior is normal.  Nursing note and vitals reviewed.   ED Course  Procedures  DIAGNOSTIC STUDIES: Oxygen Saturation is 96% on RA, adequate by my interpretation.  COORDINATION OF CARE: 3:20 PM Discussed treatment plan which includes tetanus shot and antibiotic with pt at bedside and pt agreed to plan.  3:31 PM  Patient here with an open wound to his left tib-fib from a mechanical injury 2 weeks ago. Patient states he is not up-to-date with his tetanus, will give tetanus booster shot. He also is concern of this nonhealing ulceration when considering his history of diabetes. Therefore, will provide antibiotic as treatment due to the increased likelihood of prolonged wound healing and possibility of infection. Return precautions discussed.  Labs Review Labs Reviewed - No data to display  Imaging Review No results found.   EKG Interpretation None       MDM   Final diagnoses:  Leg wound, left, sequela    BP 123/70 mmHg  Pulse 72  Temp(Src) 97.8 F (36.6 C) (Oral)  Resp 16  SpO2 96%  I personally performed the services described in this documentation, which was scribed in my presence. The recorded information has been reviewed and is accurate.    Fayrene HelperBowie Corley Kohls, PA-C 02/18/15 1532  Fayrene HelperBowie Elleana Stillson, PA-C 02/18/15 1534  Toy CookeyMegan Docherty, MD 02/19/15 [phone]

## 2015-02-18 NOTE — Discharge Instructions (Signed)
Cuidados de la laceracin de la piel  (Skin Tear Care)  Se llama laceracin cuando se rompe la capa ms externa de la piel. Se trata de un problema frecuente que aparece con el envejecimiento a medida que la piel se hace ms delgada y ms frgil. Adems, algunos medicamentos como los corticoides por va oral producen afinamiento de la piel si se toman durante largos perodos.  Puede solucionarse colocando una cinta adhesiva o esteri-trips. Esto hace que la piel que se ha despellejado quede en contacto con la piel sana que queda debajo. Podrn colocarle un vendaje (apsito) segn la localizacin de la herida, sobre la cinta [place] o esteri-trips. En algunos casos, [employer] de curacin, la piel se vuelve negra y [place]. An cuando esto ocurra, la piel lacerada acta como un vendaje hasta que la piel que se encuentra debajo est sana y se cure sola.  INSTRUCCIONES PARA EL CUIDADO EN EL HOGAR   Cambie el vendaje una vez por da o segn le indic el mdico.  Limpie suavemente la herida y la zona que la rodea, usando solucin fisiolgica o un jabn suave y [place].  No frote la piel lesionada si est seca. Deje la zona al Alexis Goodell.  Aplique vaselina o una crema o ungento con antibitico para Therapist, nutritional. Esto ayudar a curar la herida. No deje que se forme Serita Grammes.  Si el vendaje se pega, humedzcalo con [place] y retrelo suavemente antes de cambiarlo.  Proteja la piel herida hasta que se cure.  Tome slo medicamentos de venta libre o recetados, segn las indicaciones del mdico.  Tornillo duchas o baos usando agua jabonosa tibia. Aplique un nuevo vendaje despus de ducharse o baarse.  Cumpla con todas las visitas de control, segn le indique su mdico.  SOLICITE ATENCIN MDICA DE INMEDIATO SI:   Presenta enrojecimiento, hinchazn o aumento del dolor en el sitio de la herida.  Tiene pus en el sitio de Passenger transport manager.  Siente escalofros.  Hay rayas rojas que  salen de la herida.  Advierte un olor ftido que proviene de la herida o del vendaje.  Tiene fiebre o sntomas persistentes durante ms de 2  3 das.  Tiene fiebre y los sntomas empeoran repentinamente. ASEGRESE DE QUE:   Comprende estas instrucciones.  Controlar la enfermedad.  Solicitar ayuda de inmediato si el nio no mejora o si empeora. Document Released: 04/28/2005 Document Revised: 04/12/2012 [facility] Patient Information 2015 [place]. This information is not intended to replace advice given to you by your health care provider. Make sure you discuss any questions you have with your health care provider.

## 2015-02-18 NOTE — ED Notes (Signed)
Pt reports 2 weeks ago cut his left lower leg, here today for wound check. Says doctor didn't tell him what medicines to take or what to do.

## 2015-04-12 ENCOUNTER — Emergency Department (HOSPITAL_COMMUNITY)
Admission: EM | Admit: 2015-04-12 | Discharge: 2015-04-12 | Disposition: A | Discharge status: Home / Self Care | Payer: Self-pay | Attending: Emergency Medicine | Admitting: Emergency Medicine

## 2015-04-12 ENCOUNTER — Encounter (HOSPITAL_COMMUNITY): Payer: Self-pay | Admitting: *Deleted

## 2015-04-12 DIAGNOSIS — M545 Low back pain: Secondary | ICD-10-CM | POA: Insufficient documentation

## 2015-04-12 DIAGNOSIS — R739 Hyperglycemia, unspecified: Secondary | ICD-10-CM

## 2015-04-12 DIAGNOSIS — E1165 Type 2 diabetes mellitus with hyperglycemia: Secondary | ICD-10-CM | POA: Insufficient documentation

## 2015-04-12 DIAGNOSIS — Z87891 Personal history of nicotine dependence: Secondary | ICD-10-CM | POA: Insufficient documentation

## 2015-04-12 LAB — URINE MICROSCOPIC-ADD ON

## 2015-04-12 LAB — URINALYSIS, ROUTINE W REFLEX MICROSCOPIC
BILIRUBIN URINE: NEGATIVE
Glucose, UA: >1000 mg/dL — AB
HGB URINE DIPSTICK: NEGATIVE
Ketones, ur: NEGATIVE mg/dL
Leukocytes, UA: NEGATIVE
Nitrite: NEGATIVE
Protein, ur: NEGATIVE mg/dL
SPECIFIC GRAVITY, URINE: 1.045 — AB (ref 1.005–1.030)
Urobilinogen, UA: 1 mg/dL (ref 0.0–1.0)
pH: 5.5 (ref 5.0–8.0)

## 2015-04-12 LAB — CBC
HCT: 41.2 % (ref 39.0–52.0)
Hemoglobin: 15.1 g/dL (ref 13.0–17.0)
MCH: 32.2 pg (ref 26.0–34.0)
MCHC: 36.7 g/dL — AB (ref 30.0–36.0)
MCV: 87.8 fL (ref 78.0–100.0)
Platelets: 136 10*3/uL — ABNORMAL LOW (ref 150–400)
RBC: 4.69 MIL/uL (ref 4.22–5.81)
RDW: 12.9 % (ref 11.5–15.5)
WBC: 3.9 10*3/uL — ABNORMAL LOW (ref 4.0–10.5)

## 2015-04-12 LAB — BASIC METABOLIC PANEL
Anion gap: 9 (ref 5–15)
BUN: 13 mg/dL (ref 6–20)
CHLORIDE: 100 mmol/L — AB (ref 101–111)
CO2: 23 mmol/L (ref 22–32)
Calcium: 9.2 mg/dL (ref 8.9–10.3)
Creatinine, Ser: 0.93 mg/dL (ref 0.61–1.24)
GFR calc Af Amer: >60 mL/min (ref >60)
GFR calc non Af Amer: >60 mL/min (ref >60)
GLUCOSE: 391 mg/dL — AB (ref 65–99)
POTASSIUM: 3.8 mmol/L (ref 3.5–5.1)
Sodium: 132 mmol/L — ABNORMAL LOW (ref 135–145)

## 2015-04-12 LAB — CBG MONITORING, ED
GLUCOSE-CAPILLARY: 339 mg/dL — AB (ref 65–99)
Glucose-Capillary: 395 mg/dL — ABNORMAL HIGH (ref 65–99)
Glucose-Capillary: 358 mg/dL — ABNORMAL HIGH (ref 65–99)
Glucose-Capillary: 282 mg/dL — ABNORMAL HIGH (ref 65–99)

## 2015-04-12 MED ORDER — METFORMIN HCL 1000 MG PO TABS: 1000.0000 mg | ORAL_TABLET | Freq: Two times a day (BID) | ORAL | Status: DC

## 2015-04-12 MED ORDER — GLYBURIDE 2.5 MG PO TABS: 2.5000 mg | ORAL_TABLET | Freq: Every day | ORAL | Status: DC

## 2015-04-12 MED ORDER — INSULIN ASPART 100 UNIT/ML ~~LOC~~ SOLN
10.0000 [IU] | Freq: Once | SUBCUTANEOUS | Status: AC
  Administered 2015-04-12: 10 [IU] via SUBCUTANEOUS
  Filled 2015-04-12: qty 1

## 2015-04-12 NOTE — Discharge Instructions (Signed)
Take Metformin and Glyburide as prescribed for your symptoms. Follow up with your primary doctor at the free clinic. If you're not able to be seen at the free clinic, follow-up with the [facility] for primary care. Return to the emergency department if symptoms worsen.  Tomar metformina y gliburida como se prescribe para tratar sus sntomas. Haga un seguimiento con su mdico de cabecera en la clnica gratuita. Si usted no es Brewing technologist de ser visto en la Forensic scientist, el seguimiento con el Cono centro de la salud comunitaria [place] para la atencin [place]. Volver a la sala de urgencias si los sntomas [place].  Hiperglucemia (Hyperglycemia) La hiperglucemia ocurre cuando la glucosa (azcar) en su sangre est demasiado elevada. Puede suceder por varias razones, pero a menudo ocurre en personas que no saben que tienen diabetes o no la controlan adecuadamente.  CAUSAS Tanto si tiene diabetes como si no, existen otras causas para la hiperglucemia. La hiperglucemia puede producirse cuando tiene diabetes, pero tambin puede presentarse en otras situaciones de las que podra no ser consciente, como por ejemplo: Diabetes  Si tiene diabetes y tiene problemas para controlar su glucosa en sangre, la hiperglucemia podra producirse debido a las siguientes razones:  No seguir Press photographer.  No tomar los medicamentos para la diabetes o tomarlos de forma inadecuada.  Realizar menos ejercicio del que normalmente hace.  Estar enfermo. Prediabetes  Esto no puede ignorarse. Antes de que la persona presente diabetes de tipo 2, casi siempre hay "prediabetes". Esto ocurre cuando su glucosa en sangre es mayor que lo normal, pero no lo suficiente como para diagnosticar diabetes. La investigacin ha demostrado que algunos daos al cuerpo de Air cabin crew, en especial los del corazn y el sistema circulatorio, podran haber ocurrido durante el periodo de prediabetes. Si controla la  glucosa en sangre cuando tiene prediabetes, podr retardar o evitar que se desarrrolle la diabetes tipo 2. El estrs  Si tiene diabetes, deber hacer una dieta, tomar medicamentos orales o insulina para [place]. Sin embargo, Clinical research associate que la glucosa en sangre es mayor que lo normal en el hospital tenga o no diabetes. Cientficamente se lo denomina "hiperglucemia por estrs". El estrs puede elevar su glucosa en sangre. Esto ocurre porque el organismo genera hormonas en los momentos de estrs. Si el estrs ha [employer] causa del alto nivel de glucosa en [place], [place] mdico podr Education officer, environmental un seguimiento de [place] regular. Feliberto Harts, podr asegurarse de que la hiperglucemia no empeora o progresa hacia diabetes. Esteroides  Los esteroides son medicamentos que actan en la infeccin que ataca al sistema inmunolgico para bloquear la inflamacin o la infeccin. Un efecto secundario puede ser el aumento de glucosa en [place]. Muchas personas pueden producir la suficiente insulina extra para este aumento, pero aquellos que no pueden, los esteroides pueden [employer] niveles sean an [place]. No es inusual que los tratamientos con esteorides "destapen" una diabetes que se est desarrollando. No siempre es posible determinar si la hiperglucemia desaparecer una vez que se detenga el consumo de esteroides. A veces se realiza un anlisis de sangre especial denominado A1c para determinar si la glucosa en sangre se ha elevado antes de comenzar con el consumo de esteroides. SNTOMAS  Sed.  Necesidad frecuente de Geographical information systems officer.  [employer].  Visin borrosa.  Cansancio o fatiga.  Debilidad.  Somnolencia.  Hormigueo en el pie o pierna. DIAGNSTICO El diagnstico se realiza mediante el control de la glucosa en sangre de [place] o  varias de las siguientes maneras:  Anlisis A1c. Es una sustancia qumica que se encuentra en la [place].  Control de glucosa en sangre con tiras de prueba.  Resultados de  laboratorio. TRATAMIENTO Primero, es importante conocer la causa de la hiperglucemia antes de tratarla. El tratamiento puede ser el siguiente, [place] pueden ser otros:  Educacin  Cambios o ajustes en los medicamentos.  Cambios o ajustes en el plan de alimentacin.  Tratamiento por enfermedades, infecciones, etc.  Control de glucosa en sangre ms frecuente.  Cambios en el plan de ejercicios.  Disminucin o interrupcin del consumo de esteroides.  Cambios en el estilo de vida. INSTRUCCIONES PARA EL CUIDADO DOMICILIARIO  Contrlese la glucosa en sangre, como se lo indicaron.  Haga ejercicios regularmente. El profesional que lo asiste le dar instrucciones relacionadas con el ejercicio fsico. La prediabetes que es consecuencia de situaciones de estrs, puede mejorar con la actividad fsica.  Consuma alimentos saludables y balanceados. Coma a menudo y de [place] regular, en momentos fijos. El profesional o el nutricionista le dar una dieta especial para controlar su ingestin de azcar.  Mantener su peso ideal es importante. Si lo necesita, perder un poco de peso, como 5  7 Kg. puede ser beneficioso para [employer] niveles de Production assistant, radio. SOLICITE ATENCIN MDICA SI:  Tiene preguntas relacionadas con los medicamentos, la actividad o la dieta.  Contina teniendo sntomas (como mucha sed, deseos intensos de Geographical information systems officer o aumento de peso) SOLICITE ATENCIN MDICA DE INMEDIATO SI:  Vomita o tiene diarrea.  Su respiracin huele frutal.  La frecuencia respiratoria es ms rpida o ms lenta.  Est somnoliento o incoherente.  Siente adormecimiento, hormigueos o Tax adviser o en las manos.  Siente dolor en el pecho.  Sus sntomas empeoran aunque haya seguido las indicaciones de su mdico.  Tiene otras preguntas o preocupaciones. Document Released: 07/19/2005 Document Revised: 10/11/2011 [facility] Patient Information 2015 [place], [place]. This information is not intended  to replace advice given to you by your health care provider. Make sure you discuss any questions you have with your health care provider.  La diabetes mellitus y los alimentos (Diabetes Mellitus and Food) Es importante que controle su nivel de azcar en la sangre (glucosa). El nivel de glucosa en sangre depende en gran medida de lo que usted come. [place] alimentos saludables en las cantidades [place] a lo largo del Futures trader, aproximadamente a la misma hora [employer], lo ayudar a Chief Operating Officer su nivel de Event organiser. Tambin puede ayudarlo a retrasar o Fish farm manager de la diabetes mellitus. [place] de [employer] saludable incluso puede ayudarlo a Event organiser de presin arterial y a Barista o Pharmacologist un peso saludable.  CMO PUEDEN AFECTARME LOS ALIMENTOS? Carbohidratos Los carbohidratos afectan el nivel de glucosa en sangre ms que cualquier otro tipo de alimento. El nutricionista lo ayudar a Chief Strategy Officer cuntos carbohidratos puede consumir en cada comida y ensearle a contarlos. El recuento de carbohidratos es importante para mantener la glucosa en sangre en un nivel saludable, en especial si utiliza insulina o toma determinados medicamentos para la diabetes mellitus. Alcohol El alcohol puede provocar disminuciones sbitas de la glucosa en sangre (hipoglucemia), en especial si utiliza insulina o toma determinados medicamentos para la diabetes mellitus. La hipoglucemia es una afeccin que puede poner en peligro la vida. Los sntomas de la hipoglucemia (somnolencia, mareos y Administrator) son similares a los sntomas de haber consumido mucho alcohol.  Si el mdico lo autoriza a beber alcohol, hgalo  con moderacin y siga estas pautas:  Las mujeres no deben beber ms de un trago por da, y los hombres no deben beber ms de dos tragos por Futures trader. Un trago es igual a:  12 onzas (355 ml) de cerveza  5 onzas de vino (150 ml) de vino  1,5onzas (45ml) de bebidas espirituosas  No beba con  el estmago vaco.  Mantngase hidratado. Beba agua, gaseosas dietticas o t helado sin azcar.  Las gaseosas comunes, los jugos y otros refrescos podran contener muchos carbohidratos y se Heritage manager. QU ALIMENTOS NO SE RECOMIENDAN? Cuando haga las elecciones de alimentos, es importante que recuerde que todos los alimentos son distintos. Algunos tienen menos nutrientes que otros por porcin, aunque podran tener la misma cantidad de caloras o carbohidratos. Es difcil darle al cuerpo lo que necesita cuando consume alimentos con menos nutrientes. Estos son algunos ejemplos de alimentos que debera evitar ya que contienen muchas caloras y carbohidratos, pero pocos nutrientes:  Neurosurgeon trans (la mayora de los alimentos procesados incluyen grasas trans en la etiqueta de Informacin nutricional).  Gaseosas comunes.  Jugos.  Caramelos.  Dulces, como tortas, pasteles, rosquillas y [place].  Comidas fritas. QU ALIMENTOS PUEDO COMER? Consuma alimentos ricos en nutrientes, que nutrirn el cuerpo y lo mantendrn saludable. Los alimentos que debe comer tambin dependern de varios factores, como:  Las caloras que necesita.  Los medicamentos que toma.  Su peso.  El nivel de glucosa en [place].  El [place] de presin arterial.  El nivel de colesterol. Tambin debe consumir una variedad de [place], como:  Protenas, como carne, aves, pescado, tofu, frutos secos y semillas (las protenas de [place] magros son mejores).  Nils Pyle.  Verduras.  Productos lcteos, como [place], queso y yogur (descremados son mejores).  Panes, granos, pastas, cereales, arroz y frijoles.  Grasas, como aceite de [place], [place] sin grasas trans, aceite de canola, aguacate y [place]. TODOS LOS QUE PADECEN DIABETES MELLITUS TIENEN EL MISMO PLAN DE COMIDAS? Dado que todas las personas que padecen diabetes mellitus son distintas, no hay un solo plan de comidas que funcione para todos. Es muy  importante que se rena con un nutricionista que lo ayudar a crear un plan de comidas adecuado para usted. Document Released: 10/26/2007 Document Revised: 07/24/2013 [facility] Patient Information 2015 [place], [place]. This information is not intended to replace advice given to you by your health care provider. Make sure you discuss any questions you have with your health care provider.

## 2015-04-12 NOTE — ED Notes (Addendum)
Pt reports cbg > 500. Only complaint is back pain. Denies pain with urination. Denies n/v. Hx of diabetes and reports taking meds as prescribed.

## 2015-04-12 NOTE — ED Provider Notes (Signed)
CSN: [id number]     Arrival date & time 04/12/15  1636 History   First MD Initiated Contact with Patient 04/12/15 2004     Chief Complaint  Patient presents with  . Hyperglycemia     (Consider location/radiation/quality/duration/timing/severity/associated sxs/prior Treatment) HPI Comments: 75 her old male presents to the emergency department for evaluation of hyperglycemia. Patient states that his blood sugar was 450 today. He states that he normally runs around 250, but has been running much higher over the past month. Patient reports compliance with his metformin regimen. He takes 1000 mg twice a day. Patient has been noticing increased thirst and urinary frequency. No associated fever, chest pain, shortness of breath, dysuria, hematuria, nausea, or abdominal pain. Patient [facility]. He also has complaints of left-sided low back pain which has been intermittent over the past month and is worse after long periods of working. Pain is nonradiating and patient has taken no medications for his pain. No history of trauma or injury to his back. Patient denies numbness or weakness as well as bowel/bladder incontinence.  Patient is a 54 y.o. male presenting with hyperglycemia. The history is provided by the patient. No language interpreter was used.  Hyperglycemia Associated symptoms: increased thirst and polyuria   Associated symptoms: no abdominal pain, no chest pain, no dysuria, no fever, no nausea and no shortness of breath     Past Medical History  Diagnosis Date  . Diabetes mellitus without complication    Past Surgical History  Procedure Laterality Date  . Abdominal surgery     History reviewed. No pertinent family history. Social History  Substance Use Topics  . Smoking status: Former Smoker    Quit date: 07/17/2012  . Smokeless tobacco: None  . Alcohol Use: No    Review of Systems  Constitutional: Negative for fever.  Respiratory: Negative for shortness of  breath.   Cardiovascular: Negative for chest pain.  Gastrointestinal: Negative for nausea and abdominal pain.  Endocrine: Positive for polydipsia and polyuria.  Genitourinary: Negative for dysuria.       Negative for bowel/bladder incontinence  Musculoskeletal: Positive for back pain.  All other systems reviewed and are negative.   Allergies  Review of patient's allergies indicates no known allergies.  Home Medications   Prior to Admission medications   Medication Sig Start Date End Date Taking? Authorizing Provider  glyBURIDE (DIABETA) 2.5 MG tablet Take 1 tablet (2.5 mg total) by mouth daily with breakfast. 04/12/15   Antony Madura, PA-C  metFORMIN (GLUCOPHAGE) 1000 MG tablet Take 1 tablet (1,000 mg total) by mouth 2 (two) times daily with a meal. 04/12/15   Antony Madura, PA-C   BP 110/63 mmHg  Pulse 50  Temp(Src) 97.7 F (36.5 C) (Oral)  Resp 16  Ht  (1.575 m)  Wt 128 lb 3.2 oz (58.151 kg)  BMI 23.44 kg/m2  SpO2 98%   Physical Exam  Constitutional: He is oriented to person, place, and time. He appears well-developed and well-nourished. No distress.  Pleasant, nontoxic appearing  HENT:  Head: Normocephalic and atraumatic.  Mouth/Throat: No oropharyngeal exudate.  Mildly dry mm  Eyes: Conjunctivae and EOM are normal. Pupils are equal, round, and reactive to light. No scleral icterus.  Neck: Normal range of motion.  Cardiovascular: Normal rate, regular rhythm and intact distal pulses.   Pulmonary/Chest: Effort normal. No respiratory distress.  Respirations even and unlabored  Musculoskeletal: Normal range of motion.  Neurological: He is alert and oriented to person,  place, and time. He exhibits normal muscle tone. Coordination normal.  GCS 15. Patient moving all extremities  Skin: Skin is warm and dry. No rash noted. He is not diaphoretic. No erythema. No pallor.  Psychiatric: He has a normal mood and affect. His behavior is normal.  Nursing note and vitals  reviewed.   ED Course  Procedures (including critical care time) Labs Review Labs Reviewed  BASIC METABOLIC PANEL - Abnormal; Notable for the following:    Sodium 132 (*)    Chloride 100 (*)    Glucose, Bld 391 (*)    All other components within normal limits  CBC - Abnormal; Notable for the following:    WBC 3.9 (*)    MCHC 36.7 (*)    Platelets 136 (*)    All other components within normal limits  URINALYSIS, ROUTINE W REFLEX MICROSCOPIC (NOT AT [facility]) - Abnormal; Notable for the following:    Specific Gravity, Urine 1.045 (*)    Glucose, UA >1000 (*)    All other components within normal limits  URINE MICROSCOPIC-ADD ON - Abnormal; Notable for the following:    Squamous Epithelial / LPF FEW (*)    All other components within normal limits  CBG MONITORING, ED - Abnormal; Notable for the following:    Glucose-Capillary 395 (*)    All other components within normal limits  CBG MONITORING, ED - Abnormal; Notable for the following:    Glucose-Capillary 358 (*)    All other components within normal limits  CBG MONITORING, ED - Abnormal; Notable for the following:    Glucose-Capillary 339 (*)    All other components within normal limits  CBG MONITORING, ED - Abnormal; Notable for the following:    Glucose-Capillary 282 (*)    All other components within normal limits    Imaging Review No results found.   I have personally reviewed and evaluated these images and lab results as part of my medical decision-making.   EKG Interpretation None      MDM   Final diagnoses:  Hyperglycemia    55 year old male presents to the emergency department for hyperglycemia. He reports sugars in the 400-500 range over the past month. Patient is a known type II diabetic. Blood sugar 391 on arrival. Sugar has improved to 282 with 10 units of subcutaneous insulin. Patient has no signs of DKA on workup. Urine negative for ketonuria. Anion gap normal. Thrombocytopenia and white blood cell count  fairly consistent with baseline. No fever.  As patient has maximized his metformin therapy will try adding low dose glyburide to regimen until patient is able to f/u with his PCP. Patient seen by the free clinic, but will refer to [facility] as well if unable to obtain and appointment here. Diet instructions and return precautions given at discharge. Patient discharged in good condition with no unaddressed concerns; VSS.   Filed Vitals:   04/12/15 2045 04/12/15 2145 04/12/15 2215 04/12/15 2230  BP: 110/66 110/59 107/60 110/63  Pulse: 48 48 51 50  Temp:      TempSrc:      Resp:      Height:      Weight:      SpO2: 100% 97% 98% 98%     Antony Madura, PA-C 04/12/15 2250  Tilden Fossa, MD 04/13/15 [id number]

## 2015-08-07 ENCOUNTER — Encounter (HOSPITAL_COMMUNITY): Payer: Self-pay | Admitting: Neurology

## 2015-08-07 ENCOUNTER — Emergency Department (HOSPITAL_COMMUNITY)
Admission: EM | Admit: 2015-08-07 | Discharge: 2015-08-07 | Disposition: A | Discharge status: Home / Self Care | Payer: Self-pay | Attending: Emergency Medicine | Admitting: Emergency Medicine

## 2015-08-07 DIAGNOSIS — Z760 Encounter for issue of repeat prescription: Secondary | ICD-10-CM | POA: Insufficient documentation

## 2015-08-07 DIAGNOSIS — Z87891 Personal history of nicotine dependence: Secondary | ICD-10-CM | POA: Insufficient documentation

## 2015-08-07 DIAGNOSIS — R739 Hyperglycemia, unspecified: Secondary | ICD-10-CM

## 2015-08-07 DIAGNOSIS — E1165 Type 2 diabetes mellitus with hyperglycemia: Secondary | ICD-10-CM | POA: Insufficient documentation

## 2015-08-07 LAB — BASIC METABOLIC PANEL
Anion gap: 9 (ref 5–15)
BUN: 17 mg/dL (ref 6–20)
CO2: 27 mmol/L (ref 22–32)
CREATININE: 0.87 mg/dL (ref 0.61–1.24)
Calcium: 9.5 mg/dL (ref 8.9–10.3)
Chloride: 98 mmol/L — ABNORMAL LOW (ref 101–111)
GFR calc Af Amer: >60 mL/min (ref >60)
Glucose, Bld: 595 mg/dL (ref 65–99)
Potassium: 4.8 mmol/L (ref 3.5–5.1)
SODIUM: 134 mmol/L — AB (ref 135–145)

## 2015-08-07 LAB — CBC
HCT: 43.2 % (ref 39.0–52.0)
Hemoglobin: 15.1 g/dL (ref 13.0–17.0)
MCH: 30.3 pg (ref 26.0–34.0)
MCHC: 35 g/dL (ref 30.0–36.0)
MCV: 86.7 fL (ref 78.0–100.0)
PLATELETS: 134 10*3/uL — AB (ref 150–400)
RBC: 4.98 MIL/uL (ref 4.22–5.81)
RDW: 12.7 % (ref 11.5–15.5)
WBC: 5 10*3/uL (ref 4.0–10.5)

## 2015-08-07 LAB — URINE MICROSCOPIC-ADD ON: RBC / HPF: NONE SEEN RBC/hpf (ref 0–5)

## 2015-08-07 LAB — CBG MONITORING, ED
GLUCOSE-CAPILLARY: 331 mg/dL — AB (ref 65–99)
Glucose-Capillary: 467 mg/dL — ABNORMAL HIGH (ref 65–99)

## 2015-08-07 LAB — URINALYSIS, ROUTINE W REFLEX MICROSCOPIC
BILIRUBIN URINE: NEGATIVE
Glucose, UA: >1000 mg/dL — AB
Hgb urine dipstick: NEGATIVE
KETONES UR: 15 mg/dL — AB
LEUKOCYTES UA: NEGATIVE
NITRITE: NEGATIVE
Protein, ur: NEGATIVE mg/dL
Specific Gravity, Urine: 1.039 — ABNORMAL HIGH (ref 1.005–1.030)
pH: 6.5 (ref 5.0–8.0)

## 2015-08-07 MED ORDER — GLYBURIDE 2.5 MG PO TABS: 2.5000 mg | ORAL_TABLET | Freq: Every day | ORAL | Status: DC

## 2015-08-07 MED ORDER — SODIUM CHLORIDE 0.9 % IV BOLUS (SEPSIS): 1000.0000 mL | Freq: Once | INTRAVENOUS | Status: DC

## 2015-08-07 MED ORDER — METFORMIN HCL 1000 MG PO TABS: 1000.0000 mg | ORAL_TABLET | Freq: Two times a day (BID) | ORAL | Status: DC

## 2015-08-07 NOTE — ED Notes (Addendum)
Pt here with refill for metformin hasn't taken for 1 month. Thinks his sugar is high and he had been urinating a lot at night. Thinks he needs insulin.

## 2015-08-07 NOTE — ED Notes (Signed)
Glucose 595 reported to dr Verdie Mosherliu

## 2015-08-07 NOTE — Care Management Note (Signed)
Case Management Note  Patient Details  Name: Kevin Hubbard MRN: [id number] Date of Birth: 04-15-1961  Subjective/Objective:                  55 yo Pt here with refill for metformin hasn't taken for 1 month.//Home with family.   Action/Plan: Follow for disposition needs.   Expected Discharge Date:       08/07/15           Expected Discharge Plan:  Home/Self Care  In-House Referral:  NA  Discharge planning Services  Follow-up appt scheduled, CM Consult  Post Acute Care Choice:  NA Choice offered to:  Patient  DME Arranged:  N/A DME Agency:  NA  HH Arranged:  NA HH Agency:  NA  Status of Service:  Completed, signed off  Medicare Important Message Given:    Date Medicare IM Given:    Medicare IM give by:    Date Additional Medicare IM Given:    Additional Medicare Important Message give by:     If discussed at [place] of Stay Meetings, dates discussed:    Additional Comments: Kevin Hubbard J. Clydene Laming, RN, BSN, [place] [phone] ED CM consulted regarding PCP establishment and insurance enrollment. Pt presented to [facility] ED today with medication refill. NCM met with pt at bedside; pt confirms not having access to f/u care with PCP or insurance coverage. Discussed with patient importance and benefits of establishing PCP, and not utilizing the ED for primary care needs. Pt verbalized understanding and is in agreement. Discussed other options, provided list of local  affordable PCPs.  Pt voiced interest in the [facility] and [place].  NCM advised that [facility]  Internal Medicine providers are seeing pts at [facility]. Pt verbalized understanding. NCM set up appointment at [facility] with Cammie Sickle, NP on 09/01/15 at 0900.  NCM informed pt they may visit [place] and [place] for Rx needs upon discharge.    Fuller Mandril, RN 08/07/2015, 2:38 PM

## 2015-08-07 NOTE — Discharge Instructions (Signed)
Hiperglucemia °(Hyperglycemia) °La hiperglucemia ocurre cuando la glucosa (azúcar) en su sangre está demasiado elevada. Puede suceder por varias razones, pero a menudo ocurre en personas que no saben que tienen diabetes o no la controlan adecuadamente.  °CAUSAS °Tanto si tiene diabetes como si no, existen otras causas para la hiperglucemia. La hiperglucemia puede producirse cuando tiene diabetes, pero también puede presentarse en otras situaciones de las que podría no ser consciente, como por ejemplo: °Diabetes °· Si tiene diabetes y tiene problemas para controlar su glucosa en sangre, la hiperglucemia podría producirse debido a las siguientes razones: °¨ No seguir el plan de alimentación. °¨ No tomar los medicamentos para la diabetes o tomarlos de forma inadecuada. °¨ Realizar menos ejercicio del que normalmente hace. °¨ Estar enfermo. °Prediabetes °· Esto no puede ignorarse. Antes de que la persona presente diabetes de tipo 2, casi siempre hay "prediabetes". Esto ocurre cuando su glucosa en sangre es mayor que lo normal, pero no lo suficiente como para diagnosticar diabetes. La investigación ha demostrado que algunos daños al cuerpo de largo plazo, en especial los del corazón y el sistema circulatorio, podrían haber ocurrido durante el periodo de prediabetes. Si controla la glucosa en sangre cuando tiene prediabetes, podrá retardar o evitar que se desarrrolle la diabetes tipo 2. °El estrés °· Si tiene diabetes, deberá hacer una dieta, tomar medicamentos orales o insulina para controlarla. Sin embargo, encontrará que la glucosa en sangre es mayor que lo normal en el hospital tenga o no diabetes. Científicamente se lo denomina "hiperglucemia por estrés". El estrés puede elevar su glucosa en sangre. Esto ocurre porque el organismo genera hormonas en los momentos de estrés. Si el estrés ha sido la causa del alto nivel de glucosa en sangre, el médico podrá realizar un seguimiento de manera regular. De esta manera,  podrá asegurarse de que la hiperglucemia no empeora o progresa hacia diabetes. °Esteroides °· Los esteroides son medicamentos que actúan en la infección que ataca al sistema inmunológico para bloquear la inflamación o la infección. Un efecto secundario puede ser el aumento de glucosa en sangre. Muchas personas pueden producir la suficiente insulina extra para este aumento, pero aquellos que no pueden, los esteroides pueden hacer que los niveles sean aún mayores. No es inusual que los tratamientos con esteorides "destapen" una diabetes que se está desarrollando. No siempre es posible determinar si la hiperglucemia desaparecerá una vez que se detenga el consumo de esteroides. A veces se realiza un análisis de sangre especial denominado A1c para determinar si la glucosa en sangre se ha elevado antes de comenzar con el consumo de esteroides. °SÍNTOMAS °· Sed. °· Necesidad frecuente de orinar. °· Boca seca. °· Visión borrosa. °· Cansancio o fatiga. °· Debilidad. °· Somnolencia. °· Hormigueo en el pie o pierna. °DIAGNÓSTICO °El diagnóstico se realiza mediante el control de la glucosa en sangre de una o varias de las siguientes maneras: °· Análisis A1c. Es una sustancia química que se encuentra en la sangre. °· Control de glucosa en sangre con tiras de prueba. °· Resultados de laboratorio. °TRATAMIENTO °Primero, es importante conocer la causa de la hiperglucemia antes de tratarla. El tratamiento puede ser el siguiente, aunque pueden ser otros: °· Educación °· Cambios o ajustes en los medicamentos. °· Cambios o ajustes en el plan de alimentación. °· Tratamiento por enfermedades, infecciones, etc. °· Control de glucosa en sangre más frecuente. °· Cambios en el plan de ejercicios. °· Disminución o interrupción del consumo de esteroides. °· Cambios en el estilo de vida. °INSTRUCCIONES PARA   EL CUIDADO DOMICILIARIO °· Contrólese la glucosa en sangre, como se lo indicaron. °· Haga ejercicios regularmente. El profesional que lo  asiste le dará instrucciones relacionadas con el ejercicio físico. La prediabetes que es consecuencia de situaciones de estrés, puede mejorar con la actividad física. °· Consuma alimentos saludables y balanceados. Coma a menudo y de manera regular, en momentos fijos. El profesional o el nutricionista le dará una dieta especial para controlar su ingestión de azúcar. °· Mantener su peso ideal es importante. Si lo necesita, perder un poco de peso, como 5 ó 7 Kg. puede ser beneficioso para mejorar los niveles de azúcar en sangre. °SOLICITE ATENCIÓN MÉDICA SI: °· Tiene preguntas relacionadas con los medicamentos, la actividad o la dieta. °· Continúa teniendo síntomas (como mucha sed, deseos intensos de orinar o aumento de peso) °SOLICITE ATENCIÓN MÉDICA DE INMEDIATO SI: °· Vomita o tiene diarrea. °· Su respiración huele frutal. °· La frecuencia respiratoria es más rápida o más lenta. °· Está somnoliento o incoherente. °· Siente adormecimiento, hormigueos o dolor en los pies o en las manos. °· Siente dolor en el pecho. °· Sus síntomas empeoran aunque haya seguido las indicaciones de su médico. °· Tiene otras preguntas o preocupaciones. °  °Esta información no tiene como fin reemplazar el consejo del médico. Asegúrese de hacerle al médico cualquier pregunta que tenga. °  °Document Released: 07/19/2005 Document Revised: 10/11/2011 °Elsevier Interactive Patient Education ©2016 [employer] ° °

## 2015-08-07 NOTE — ED Provider Notes (Signed)
CSN: [id number]     Arrival date & time 08/07/15  1133 History   First MD Initiated Contact with Patient 08/07/15 1412     Chief Complaint  Patient presents with  . Hyperglycemia  . Medication Refill     (Consider location/radiation/quality/duration/timing/severity/associated sxs/prior Treatment) HPI   Pt to the ER for hyperglycemia and medication refill.  He has not taken his medication for over a month due to not having a prescription. He says he is able to get them filled. He has had increased urination. He wonders if he needs insulin because his glucose increases so high sometimes. He denies having N/V/D dry mouth, confusion or weakness. He says he feels fine. Initially in triage his glucose was 467.  Past Medical History  Diagnosis Date  . Diabetes mellitus without complication [facility])    Past Surgical History  Procedure Laterality Date  . Abdominal surgery     No family history on file. Social History  Substance Use Topics  . Smoking status: Former Smoker    Quit date: 07/17/2012  . Smokeless tobacco: None  . Alcohol Use: No    Review of Systems  Review of Systems All other systems negative except as documented in the HPI. All pertinent positives and negatives as reviewed in the HPI.   Allergies  Review of patient's allergies indicates no known allergies.  Home Medications   Prior to Admission medications   Medication Sig Start Date End Date Taking? Authorizing Provider  glyBURIDE (DIABETA) 2.5 MG tablet Take 1 tablet (2.5 mg total) by mouth daily with breakfast. 08/07/15   Marlon Pel, PA-C  metFORMIN (GLUCOPHAGE) 1000 MG tablet Take 1 tablet (1,000 mg total) by mouth 2 (two) times daily with a meal. 08/07/15   Shadiyah Wernli, PA-C   BP 122/79 mmHg  Pulse 64  Temp(Src) 97.3 F (36.3 C) (Oral)  Resp 16  SpO2 97% Physical Exam  Constitutional: He appears well-developed and well-nourished. No distress.  HENT:  Head: Normocephalic and atraumatic.  Right Ear:  Tympanic membrane and ear canal normal.  Left Ear: Tympanic membrane and ear canal normal.  Nose: Nose normal.  Mouth/Throat: Uvula is midline, oropharynx is clear and moist and mucous membranes are normal.  Eyes: Pupils are equal, round, and reactive to light.  Neck: Normal range of motion. Neck supple.  Cardiovascular: Normal rate and regular rhythm.   Pulmonary/Chest: Effort normal.  Abdominal: Soft.  No signs of abdominal distention  Musculoskeletal:  No LE swelling  Neurological: He is alert.  Acting at baseline  Skin: Skin is warm and dry. No rash noted.  Nursing note and vitals reviewed.   ED Course  Procedures (including critical care time) Labs Review Labs Reviewed  BASIC METABOLIC PANEL - Abnormal; Notable for the following:    Sodium 134 (*)    Chloride 98 (*)    Glucose, Bld 595 (*)    All other components within normal limits  CBC - Abnormal; Notable for the following:    Platelets 134 (*)    All other components within normal limits  CBG MONITORING, ED - Abnormal; Notable for the following:    Glucose-Capillary 467 (*)    All other components within normal limits  CBG MONITORING, ED - Abnormal; Notable for the following:    Glucose-Capillary 331 (*)    All other components within normal limits  URINALYSIS, ROUTINE W REFLEX MICROSCOPIC (NOT AT [facility])    Imaging Review No results found. I have personally reviewed and evaluated these images  and lab results as part of my medical decision-making.   EKG Interpretation None      MDM   Final diagnoses:  Hyperglycemia  Medication refill    Pt is normal on exam, initially glucose is 467, then 595 on BMP. By the time patient got into exam room his glucose improved without intervention to 331. He has no gap or significant electrolyte abnormalities. Case Manager has given his an appointment to see a PCP with a provider as well as a location to pick up medications. I have refilled medications here in the ED. Will  have him take meds and follow-up with PCP, will not start him on insulin from the ED today.  Medications - No data to display   I feel the patient has had an appropriate workup for their chief complaint at this time and likelihood of emergent condition existing is low. Discussed s/sx that warrant return to the ED.  Filed Vitals:   08/07/15 1413 08/07/15 1500  BP: 135/92 122/79  Pulse: 60 64  Temp:    Resp:         Marlon Peliffany Zabella Wease, PA-C 08/07/15 1535  Lavera Guiseana Duo Liu, MD 08/08/15 [phone]

## 2015-09-01 ENCOUNTER — Ambulatory Visit (INDEPENDENT_AMBULATORY_CARE_PROVIDER_SITE_OTHER): Payer: Self-pay | Admitting: Family Medicine

## 2015-09-01 ENCOUNTER — Encounter: Payer: Self-pay | Admitting: Family Medicine

## 2015-09-01 VITALS — BP 117/76 | HR 67 | Temp 97.5°F | Resp 16 | Ht 62.0 in | Wt 127.0 lb

## 2015-09-01 DIAGNOSIS — E119 Type 2 diabetes mellitus without complications: Secondary | ICD-10-CM | POA: Insufficient documentation

## 2015-09-01 DIAGNOSIS — R634 Abnormal weight loss: Secondary | ICD-10-CM

## 2015-09-01 DIAGNOSIS — Z23 Encounter for immunization: Secondary | ICD-10-CM

## 2015-09-01 LAB — POCT URINALYSIS DIP (DEVICE)
BILIRUBIN URINE: NEGATIVE
Glucose, UA: 500 mg/dL — AB
HGB URINE DIPSTICK: NEGATIVE
Leukocytes, UA: NEGATIVE
NITRITE: NEGATIVE
PH: 5.5 (ref 5.0–8.0)
Protein, ur: NEGATIVE mg/dL
SPECIFIC GRAVITY, URINE: 1.01 (ref 1.005–1.030)
Urobilinogen, UA: 0.2 mg/dL (ref 0.0–1.0)

## 2015-09-01 LAB — HEMOGLOBIN A1C
Hgb A1c MFr Bld: 12.9 % — ABNORMAL HIGH
Mean Plasma Glucose: 324 mg/dL — ABNORMAL HIGH

## 2015-09-01 LAB — COMPLETE METABOLIC PANEL WITH GFR
ALT: 22 U/L (ref 9–46)
AST: 15 U/L (ref 10–35)
Albumin: 4.4 g/dL (ref 3.6–5.1)
Alkaline Phosphatase: 81 U/L (ref 40–115)
BILIRUBIN TOTAL: 1.7 mg/dL — AB (ref 0.2–1.2)
BUN: 13 mg/dL (ref 7–25)
CALCIUM: 9.6 mg/dL (ref 8.6–10.3)
CO2: 24 mmol/L (ref 20–31)
CREATININE: 0.87 mg/dL (ref 0.70–1.33)
Chloride: 102 mmol/L (ref 98–110)
GFR, Est Non African American: >89 mL/min (ref >=60)
Glucose, Bld: 354 mg/dL — ABNORMAL HIGH (ref 65–99)
Potassium: 3.9 mmol/L (ref 3.5–5.3)
Sodium: 137 mmol/L (ref 135–146)
TOTAL PROTEIN: 6.7 g/dL (ref 6.1–8.1)

## 2015-09-01 LAB — LIPID PANEL
CHOLESTEROL: 212 mg/dL — AB (ref 125–200)
HDL: 75 mg/dL (ref >=40)
LDL CALC: 114 mg/dL (ref <130)
TRIGLYCERIDES: 117 mg/dL (ref <150)
Total CHOL/HDL Ratio: 2.8 Ratio (ref <=5.0)
VLDL: 23 mg/dL (ref <30)

## 2015-09-01 LAB — HIV ANTIBODY (ROUTINE TESTING W REFLEX): HIV: NONREACTIVE

## 2015-09-01 LAB — GLUCOSE, CAPILLARY
Glucose-Capillary: 433 mg/dL — ABNORMAL HIGH (ref 65–99)
Glucose-Capillary: 323 mg/dL — ABNORMAL HIGH (ref 65–99)

## 2015-09-01 LAB — TSH: TSH: 1.665 u[IU]/mL (ref 0.350–4.500)

## 2015-09-01 MED ORDER — INSULIN REGULAR HUMAN 100 UNIT/ML IJ SOLN
10.0000 [IU] | Freq: Once | INTRAMUSCULAR | Status: AC
  Administered 2015-09-01: 10 [IU] via SUBCUTANEOUS

## 2015-09-01 NOTE — Patient Instructions (Addendum)
Will call patient call patient to discuss medication regimen after reviewing laboratory results  Recuento bsico de carbohidratos para la diabetes mellitus (Basic Carbohydrate Counting for Diabetes Mellitus) El recuento de carbohidratos es un mtodo destinado a calcular la cantidad de carbohidratos en la dieta. El consumo de carbohidratos aumenta naturalmente el nivel de azcar (glucosa) en la sangre, por lo que es importante que sepa la cantidad que debe incluir en cada comida. El recuento de carbohidratos ayuda a Advertising account executive de glucosa en la sangre dentro de los lmites normales. La cantidad permitida de carbohidratos es diferente para cada persona. Un nutricionista puede ayudarlo a calcular la cantidad adecuada para usted. Una vez que sepa la cantidad de carbohidratos que puede consumir, podr calcular los carbohidratos de los alimentos que desea comer. Los siguientes alimentos incluyen carbohidratos:  Granos, como panes y cereales.  Frijoles secos y productos con soja.  Vegetales almidonados, como papas, guisantes y maz.  Lambert Mody y jugos de frutas.  Leche y Estate agent.  Dulces y bocadillos, como pastel, galletas, caramelos, papas fritas de bolsa, refrescos y bebidas frutales con azcar. RECUENTO DE CARBOHIDRATOS [employer] de calcular los carbohidratos de los alimentos. Puede usar cualquiera de los dos mtodos o [place] combinacin de [place]. Leer la etiqueta de informacin nutricional de los alimentos envasados La informacin nutricional es una etiqueta incluida en casi todas las bebidas y los alimentos envasados de los [place]. Indica el tamao de la porcin de ese alimento o bebida e informacin sobre los nutrientes de cada porcin, incluso los gramos (g) de carbohidratos por porcin.  Decida la cantidad de porciones que comer o tomar de este alimento o bebida. Multiplique la cantidad de porciones por el nmero de gramos de carbohidratos indicados en la etiqueta para esa  porcin. El total ser la cantidad de carbohidratos que consumir al comer ese alimento o tomar esa bebida. Conocer las porciones estndar de los alimentos Cuando coma alimentos no envasados o que no incluyan la informacin nutricional en la etiqueta, deber medir las porciones para poder calcular la cantidad de carbohidratos. Una porcin de la mayora de los alimentos ricos en carbohidratos contiene alrededor de 15g de carbohidratos. La siguiente [employer] tamaos de porcin de los alimentos ricos en carbohidratos que contienen alrededor de 15g de carbohidratos por porcin:   1rebanada de pan (1oz) o 1tortilla de seis pulgadas.  panecillo de hamburguesa o bollito tipo ingls.  4a 6galletas.   de taza de cereal sin azcar y seco.   taza de cereal caliente.   de taza de arroz o pastas.  taza de pur de papas o de una papa grande al horno.  1taza de frutas frescas o una fruta pequea.  taza de frutas o jugo de frutas enlatados o congelados.  [place].   de taza de yogur descremado sin ningn agregado o de yogur endulzado con edulcorante artificial.  taza de vegetales almidonados, como guisantes, maz o papas, o de frijoles secos cocidos. Decida la cantidad de porciones Gaffer. Multiplique la cantidad de porciones por 15 (los gramos de carbohidratos en esa porcin). Por ejemplo, si come 2tazas de fresas, habr comido 2porciones y 30g de carbohidratos (2porciones x 15g = 30g). [place] y guisos, en las que se mezcla ms de un alimento, deber [employer] carbohidratos de cada alimento incluido. EJEMPLO DE RECUENTO DE CARBOHIDRATOS Ejemplo de cena  3 onzas de pechugas de pollo.   de taza de arroz integral.  taza de [place].  1 taza de fresas con crema batida sin azcar. Clculo de [place] 1: Identifique los alimentos que contienen carbohidratos:    Arroz.  Maz.  Leche.  Hughie Closs. [place] 2: Calcule el nmero de porciones que consumir de cada uno:   2 porciones de Occupational psychologist.  1 porcin de maz.  [place].  1 porcin de fresas. [place] 3: Multiplique cada una de esas porciones por 15g:   2 porciones de arroz x 15 g = 30 g.  1 porcin de maz x 15 g = 15 g.  1 porcin de leche x 15 g = 15 g.  1 porcin de fresas x 15 g = 15 g. [place] 4: Sume todas las cantidades para Armed forces logistics/support/administrative officer total de gramos de carbohidratos consumidos: 30 g + 15 g + 15 g + 15 g = 75 g.   Esta informacin no tiene Marine scientist el consejo del mdico. Asegrese de hacerle al mdico cualquier pregunta que tenga.   Document Released: 10/11/2011 Document Revised: 08/09/2014 Elsevier Interactive Patient Education 2016 [place] nivel de glucosa en la sangre - Adultos (Blood Glucose Monitoring, Adult) El control de la glucosa en la sangre (tambin llamada azcar en la sangre) lo ayudar a tener la diabetes bajo control. Tambin ayuda a que usted y Chief Financial Officer la diabetes y determinen si el tratamiento es Armed forces logistics/support/administrative officer. POR QU HAY QUE CONTROLAR LA GLUCOSA EN LA SANGRE?  Esto puede ayudar a comprender de [employer], la actividad fsica y los medicamentos inciden en los niveles de [place].  Le permite conocer el nivel de glucosa en la sangre en cualquier momento dado. Puede saber rpidamente si el nivel es bajo (hipoglucemia) o alto (hiperglucemia).  Puede ser de ayuda para que usted y el mdico sepan cmo Water engineer,  y para entender cmo controlar una enfermedad o ajustar los medicamentos para hacer ejercicio. CUNDO DEBE HACERSE LAS PRUEBAS? El mdico lo ayudar a decidir con qu frecuencia deber [employer] niveles de glucosa en la [place]. Esto puede depender del tipo de diabetes que tenga, su control de la diabetes o los tipos de medicamentos que tome. Asegrese de anotar todos los valores de la  glucosa en la [place], de modo que esta informacin pueda ser revisada por su mdico. A continuacin puede ver ejemplos de los momentos para Optometrist la prueba que el mdico puede Event organiser. Diabetes tipo1  Mdaselo al menos 2 veces al da si la diabetes est bien controlada, si [place] una bomba de insulina o si se aplica muchas inyecciones diarias.  Si la diabetes no est bien controlada o si est enfermo, puede ser necesario que se controle con ms frecuencia.  Es recomendable que tambin lo mida en estas oportunidades:  Antes de cada inyeccin de insulina.  Antes y despus de hacer ejercicio.  [place] comidas y 2horas despus de Scientist, research (physical sciences).  Ocasionalmente, entre las 2:00a.m. y las 3:00a.m. Diabetes tipo2  Si est utilizando insulina, realice la medicin al menos 2 veces al [employer]. Sin embargo, es Multimedia programmer medicin antes de cada inyeccin de [place].  Si toma medicamentos por boca (va oral), hgase la prueba 2veces por da.  Si sigue una dieta controlada, hgase la prueba una vez por da.  Si la diabetes no est bien controlada o si est enfermo, puede ser necesario que se controle con ms frecuencia. [place] EN [place]  Insumos necesarios  Medidor de Printmaker.  Tiras reactivas para el medidor. Cada medidor tiene sus propias tiras reactivas. [place] tiras reactivas correspondientes a su medidor.  Una aguja para pinchar (lanceta).  Un dispositivo que sujeta la lanceta (dispositivo de puncin).  Un diario o libro de anotaciones para [employer]. Procedimiento  Lave sus manos con agua y [place]. No se recomienda usar alcohol.  Pnchese el costado del dedo (no la punta) con Retail buyer.  Apriete suavemente el dedo hasta que aparezca una pequea gota de [place].  Siga las instrucciones que vienen con el medidor para Garment/textile technologist tira Comptroller, Midwife la sangre sobre la tira y usar el medidor de Education administrator. Otras zonas de las que se puede tomar sangre para la prueba Algunos medidores le permiten tomar sangre para la prueba de otras zonas del cuerpo (que no son el dedo). Estas reas se llaman sitios alternativos. Los sitios alternativos ms comunes son los siguientes:  El Management consultant.  El muslo.  La zona posterior de la parte inferior de la pierna.  La palma de la mano. El flujo de sangre en estas zonas es ms lento. Por lo tanto, los valores de glucosa en la sangre que obtenga pueden estar demorados, y los nmeros son diferentes de los que obtiene de los dedos. No saque sangre de sitios alternativos si cree que tiene hipoglucemia. Los valores no sern precisos. Siempre extraiga del dedo si tiene hipoglucemia. [place], si no puede darse cuenta cuando tiene bajos los niveles (hipoglucemia asintomtica), siempre extraiga sangre de los dedos para los controles de glucosa en la [place]. CONSEJOS ADICIONALES PARA EL CONTROL DE LA GLUCOSA  No vuelva a [place] lancetas.  Siempre tenga los insumos a mano.  Todos los medidores de glucosa incluyen un nmero de telfono "directo", disponible las 24 horas, al que podr llamar si tiene preguntas o [place].  Ajuste (calibre) el medidor de glucosa con una solucin de control despus de terminar algunas cajas de tiras reactivas. LLEVE REGISTROS DE LOS NIVELES DE GLUCOSA EN LA SANGRE Es recomendable llevar un diario o un registro de los valores de glucosa en la [place]. La [employer] de los medidores de glucosa, sino todos, conservan el registro de la glucosa en el dispositivo. Algunos medidores permiten descargar los registros a su computadora. Llevar un registro de los valores de glucosa en la sangre es especialmente til si desea observar los patrones. Haga anotaciones simultneas con la Teacher, English as a foreign language de los valores de glucosa en la sangre debido a que podra olvidar lo que ocurri en el momento exacto. Llevar un buen registro los ayudar a usted y al  mdico a Fish farm manager juntos para Scientist, forensic un buen control de la diabetes.    Esta informacin no tiene Marine scientist el consejo del mdico. Asegrese de hacerle al mdico cualquier pregunta que tenga.   Document Released: 07/19/2005 Document Revised: 08/09/2014 Elsevier Interactive Patient Education [employer]. Diabetes y actividad fsica (Diabetes and Exercise) Hacer actividad fsica con regularidad es muy importante. No se trata solo de [employer] Omaha. Tiene muchos otros beneficios, como por ejemplo:  Mejorar el estado fsico, la flexibilidad y la resistencia.  Aumenta la densidad sea.  Ayuda a Technical sales engineer.  Disminuye la Air traffic controller.  Aumenta la fuerza muscular.  Reduce el estrs y las tensiones.  Mejora el estado de salud general. Las personas diabticas que realizan actividad fsica tienen beneficios adicionales debido al ejercicio:  Reduce el apetito.  El organismo mejora el uso del azcar (glucosa) de la [place].  Ayuda a disminuir o Product/process development scientist.  Disminuye la presin arterial.  Ayuda a disminuir los lpidos en la sangre (colesterol y triglicridos).  El organismo mejora el uso de la insulina porque:  Aumenta la sensibilidad del organismo a la insulina.  Reduce las necesidades de insulina del organismo.  Disminuye el riesgo de enfermedad cardaca por la actividad fsica ya que  disminuye el colesterol y [employer] triglicridos.  Aumenta los niveles de colesterol bueno (como las lipoprotenas de alta densidad [HDL]) en el organismo.  Disminuye los niveles de glucosa en la [place]. SU PLAN DE ACTIVIDAD  Elija una actividad que disfrute y establezca objetivos realistas. Para ejercitarse sin riesgos, debe comenzar a Psychologist, prison and probation services cualquier actividad fsica nueva lentamente y aumentar la intensidad del ejercicio de forma gradual con el tiempo. Su mdico o educador en diabetes podrn ayudarlo a crear un plan de actividades que lo  beneficie. Las recomendaciones generales incluyen lo siguiente:  Psychologist, clinical a los nios para que realicen al menos 60 minutos de actividad fsica Armed forces operational officer.  Estirarse y Optometrist ejercicios de entrenamiento de la fuerza, como yoga o levantamiento de pesas, por lo menos 2 veces por semana.  Realizar en total por lo menos 150 minutos de ejercicios de intensidad moderada cada semana, como caminar a paso ligero o hacer gimnasia acutica.  Hacer ejercicio fsico por lo menos 3 das por semana y no dejar pasar ms de 2 das seguidos sin ejercitarse.  Evitar los perodos largos de inactividad (90 minutos o ms tiempo). Cuando deba pasar mucho tiempo sentado, haga pausas frecuentes para caminar o estirarse. RECOMENDACIONES PARA REALIZAR EJERCICIOS CUANDO SE TIENE DIABETES TIPO 1 O TIPO 2   Controle la glucosa en la sangre antes de comenzar. Si el nivel de glucosa en la sangre es de ms de 240 mg/dl, controle las cetonas en la [place]. No haga actividad fsica si hay cetonas.  Evite inyectarse insulina en las zonas del cuerpo que ejercitar. Por ejemplo, evite inyectarse insulina en:  Los brazos, si juega al tenis.  Las piernas, si corre.  Lleve un registro de:  Los alimentos que consume antes y despus de TEFL teacher.  Los momentos esperables de picos de accin de la insulina.  Los niveles de glucosa en la sangre antes y despus de hacer ejercicios.  El tipo y cantidad de [place] fsica que Musician.  Revise los registros con su mdico. El mdico lo ayudar a Actor pautas para ajustar la cantidad de alimento y las cantidades de insulina antes y despus de Field seismologist ejercicios.  Si toma insulina o agentes hipoglucemiantes por va oral, observe si hay signos y sntomas de hipoglucemia. Entre los que se incluyen:  Mareos.  Temblores.  Sudoracin.  Escalofros.  Confusin.  Beba gran cantidad de agua mientras hace ejercicios para evitar la deshidratacin o los golpes de Freight forwarder.  Durante la actividad fsica se pierde agua corporal que se debe reponer.  Comente con su mdico antes de comenzar un programa de actividad fsica para verificar que sea seguro para usted. Recuerde, cualquier actividad es mejor que ninguna.   Esta informacin no tiene Marine scientist el consejo del mdico. Asegrese de hacerle al mdico cualquier pregunta que tenga.   Document Released: 08/08/2007 Document Revised: 12/03/2014 Elsevier Interactive Patient Education 2016 [employer]. Diabetes y normas bsicas de atencin mdica (Diabetes and Standards of Medical Care) La diabetes es una enfermedad complicada. El equipo  que trate su diabetes deber incluir un nutricionista, un enfermero, un educador para la diabetes, un oftalmlogo y ms. Para que todas las personas conozcan sobre su enfermedad y para que los pacientes tengan los cuidados que necesitan, se crearon las siguientes normas bsicas para un mejor control. A continuacin se indican los estudios, vacunas, medicamentos, educacin y planes que necesitar. Prueba de HbA1c Esta prueba muestra cmo ha sido controlada su glucosa en los ltimos 2 o 3 meses. Se utiliza para verificar si el plan de control de la diabetes debe ser ajustado.   Hgalos al menos 2 veces al ao si cumple los objetivos del [place].  Si le han cambiado el tratamiento o si no cumple con los objetivos del [place], debe hacerlo 4 veces al ao. Control de la presin arterial.  Hgalas en cada visita mdica de rutina. El objetivo es tener menos de 140/90 mm Hg en la mayora de las personas, pero 130/80 mm Hg en algunos casos. Consulte a su mdico acerca de su objetivo. Examen dental.  Concurra regularmente a las visitas de control con el dentista. Examen ocular.  Si le diagnosticaron diabetes tipo 1 siendo un nio, debe hacerse estudios al llegar a los 10 aos o ms y si ha sufrido de diabetes durante 3 a 5 aos. Se recomienda hacer anualmente los exmenes  oculares despus de ese examen inicial.  Si le diagnosticaron diabetes tipo 1 siendo adulto, hgase un examen dentro de los 5 aos del diagnstico y luego una vez por ao.  Si le diagnosticaron diabetes tipo 2, hgase un estudio lo antes posible despus del diagnstico y luego una vez por ao. Examen de los pies  Se har una inspeccin visual en cada visita mdica de rutina. Estos controles observarn si hay cortes, lesiones u otros problemas en los pies.  Debe realizarse un examen completo de los pies cada ao. Esto incluye revisar la estructura y la piel de los pies, y examinar los pulsos y la sensacin de los pies.  Diabetes tipo 1: La primera prueba se realiza 5 aos despus del diagnstico.  Diabetes tipo 2: La primera prueba se realiza en el momento del diagnstico.  Contrlese los pies todas las noches para ver si hay cortes, lesiones u otros problemas. Comunquese con su mdico si observa que no se curan. Estudio de la funcin renal (microalbmina en orina)  Debe realizarse una vez por ao.  Diabetes tipo 1: La primera prueba se realiza a los 5 aos despus del diagnstico.  Diabetes tipo2: La primera prueba se realiza en el momento del diagnstico.  La creatinina srica y el ndice de filtracin glomerular estimada (eGFR, por sus siglas en ingls) se realizan una vez por ao para informar el nivel de enfermedad renal crnica, si la hubiera. Perfil lipdico (colesterol, HDL, LDL, triglicridos).  La mayora de las personas lo hacen cada 5 aos.  En relacin al LDL, el objetivo es tener menos de 100 mg/dl. Si tiene alto riesgo, el objetivo es tener menos de 70 mg/dl.  En relacin al HDL, el objetivo es [employer] 40 y 64 mg/dl para los hombres y entre 68 y 59 mg/dl para las mujeres. Un nivel de colesterol HDL de 60 mg/dl o superior da una cierta proteccin contra la enfermedad cardaca.  En relacin a los triglicridos, el objetivo es tener menos de 150 mg/dl. Vacunas  Se  recomienda aplicar de forma anual la vacuna contra la gripe a todas las personas de 6 meses en adelante que tengan  diabetes.  La vacuna contra la neumona (antineumoccica) est recomendada para todas las personas de 2 aos en adelante que tengan diabetes. Los adultos de 65 aos o ms pueden recibir la vacuna antineumoccica como una serie de dos inyecciones diferentes.  Se recomienda administrar la vacuna contra la hepatitis B en adultos poco despus de que hayan recibido el diagnstico de diabetes.  La vacuna Tdap (contra el ttanos, la difteria y la tosferina) debe aplicarse de la siguiente manera:  Segn las pautas normales de vacunacin infantil en el caso de los nios.  Cada 10 aos en el caso de los adultos con diabetes. Educacin para el autocontrol de la diabetes  Recomendaciones al momento del diagnstico y los controles segn sea necesario. Plan de tratamiento  Su plan de tratamiento ser revisado en cada visita mdica.   Esta informacin no tiene Marine scientist el consejo del mdico. Asegrese de hacerle al mdico cualquier pregunta que tenga.   Document Released: 10/13/2009 Document Revised: 08/09/2014 Elsevier Interactive Patient Education 2016 [place] diabetes mellitus y los alimentos (Diabetes Mellitus and Food) Es importante que controle su nivel de azcar en la sangre (glucosa). El nivel de glucosa en sangre depende en gran medida de lo que usted come. Comer alimentos saludables en las cantidades [place] a lo largo del Training and development officer, aproximadamente a la misma hora [employer], lo ayudar a Chief Technology Officer su nivel de Multimedia programmer. Tambin puede ayudarlo a retrasar o Patent attorney de la diabetes mellitus. Comer de [employer] saludable incluso puede ayudarlo a Chartered loss adjuster de presin arterial y a Science writer o Theatre manager un peso saludable.  Entre las recomendaciones generales para alimentarse y Audiological scientist los alimentos de forma saludable, se incluyen las  siguientes:  Respetar las comidas principales y comer colaciones con regularidad. Evitar pasar largos perodos sin comer con el fin de perder peso.  Seguir una dieta que consista principalmente en alimentos de origen vegetal, como frutas, vegetales, frutos secos, legumbres y cereales integrales.  Utilizar mtodos de coccin a baja temperatura, como hornear, en lugar de mtodos de coccin a alta temperatura, como frer en abundante aceite. Trabaje con el nutricionista para aprender a Financial planner nutricional de las etiquetas de los alimentos. CMO PUEDEN AFECTARME LOS ALIMENTOS? Carbohidratos Los carbohidratos afectan el nivel de glucosa en sangre ms que cualquier otro tipo de alimento. El nutricionista lo ayudar a Teacher, adult education cuntos carbohidratos puede consumir en cada comida y ensearle a contarlos. El recuento de carbohidratos es importante para mantener la glucosa en sangre en un nivel saludable, en especial si utiliza insulina o toma determinados medicamentos para la diabetes mellitus. Alcohol El alcohol puede provocar disminuciones sbitas de la glucosa en sangre (hipoglucemia), en especial si utiliza insulina o toma determinados medicamentos para la diabetes mellitus. La hipoglucemia es una afeccin que puede poner en peligro la vida. Los sntomas de la hipoglucemia (somnolencia, mareos y Data processing manager) son similares a los sntomas de haber consumido mucho alcohol.  Si el mdico lo autoriza a beber alcohol, hgalo con moderacin y siga estas pautas:  Las mujeres no deben beber ms de un trago por da, y los hombres no deben beber ms de dos tragos por Training and development officer. Un trago es igual a:  12 onzas (355 ml) de cerveza  5 onzas de vino (150 ml) de vino  1,5onzas (59m) de bebidas espirituosas  No beba con el estmago vaco.  Mantngase hidratado. Beba agua, gaseosas dietticas o t helado sin azcar.  Las gAllied Waste Industries los jugos y  otros refrescos podran contener muchos  carbohidratos y se Civil Service fast streamer. QU ALIMENTOS NO SE RECOMIENDAN? Cuando haga las elecciones de alimentos, es importante que recuerde que todos los alimentos son distintos. Algunos tienen menos nutrientes que otros por porcin, aunque podran tener la misma cantidad de caloras o carbohidratos. Es difcil darle al cuerpo lo que necesita cuando consume alimentos con menos nutrientes. Estos son algunos ejemplos de alimentos que debera evitar ya que contienen muchas caloras y carbohidratos, pero pocos nutrientes:  Physicist, medical trans (la mayora de los alimentos procesados incluyen grasas trans en la etiqueta de Informacin nutricional).  Gaseosas comunes.  Jugos.  Caramelos.  Dulces, como tortas, pasteles, rosquillas y [place].  Comidas fritas. QU ALIMENTOS PUEDO COMER? Consuma alimentos ricos en nutrientes, que nutrirn el cuerpo y lo mantendrn saludable. Los alimentos que debe comer tambin dependern de varios factores, como:  Las caloras que necesita.  Los medicamentos que toma.  Su peso.  El nivel de glucosa en [place].  El [place] de presin arterial.  El nivel de colesterol. Debe consumir una amplia variedad de alimentos, por ejemplo:  Protenas.  Cortes de [employer].  Protenas con bajo contenido de grasas saturadas, como pescado, clara de huevo y frijoles. Evite las carnes procesadas.  Frutas y vegetales.  Frutas y Photographer que pueden ayudar a Chief Technology Officer los niveles sanguneos de [place], como [place], mangos y batatas.  Productos lcteos.  Elija productos lcteos sin grasa o con bajo contenido de [place], como [place], yogur y [place].  Cereales, panes, pastas y arroz.  Elija cereales integrales, como panes multicereales, avena en grano y arroz integral. Estos alimentos pueden ayudar a controlar la presin arterial.  Daphene Jaeger.  Alimentos que contengan grasas saludables, como frutos secos, Musician, aceite de [place], aceite de canola y pescado. TODOS LOS QUE  PADECEN DIABETES MELLITUS TIENEN EL [place] PLAN DE [place]? Dado que todas las personas que padecen diabetes mellitus son distintas, no hay un solo plan de comidas que funcione para todos. Es muy importante que se rena con un nutricionista que lo ayudar a crear un plan de comidas adecuado para usted.   Esta informacin no tiene Marine scientist el consejo del mdico. Asegrese de hacerle al mdico cualquier pregunta que tenga.   Document Released: 10/26/2007 Document Revised: 08/09/2014 Elsevier Interactive Patient Education [employer].

## 2015-09-01 NOTE — Progress Notes (Signed)
Subjective:    Patient ID: Kevin Hubbard, male    DOB: 19-Jun-1961, 55 y.o.   MRN: [id number]  HPI  [name], a 55 year old male presents to establish care. Patient has does not have a primary provider and has primarily been using the emergency department for all primary needs. He was recently evaluated in the emergency department on 08/06/2015 for hyperglycemia. He was found to have a glucose greater than 400.Symptoms: hyperglycemia and weight loss. Symptoms have been intermittent. Currently complaining of weight loss,  polyuria, polyphagia, and polydipsia.  Patient denies hypoglycemia , nausea, paresthesia of the feet and visual disturbances.  Evaluation to date has been included: fasting blood sugar. Patient reports that he checks home sugars periodically. Treatment to date includes Metformin and Glyburide, with unsatisfactory results.    Past Medical History  Diagnosis Date  . Diabetes mellitus without complication [facility])    Past Surgical History  Procedure Laterality Date  . Abdominal surgery     Social History   Social History  . Marital Status: Single    Spouse Name: N/A  . Number of Children: N/A  . Years of Education: N/A   Occupational History  . Not on file.   Social History Main Topics  . Smoking status: Former Smoker    Quit date: 07/17/2012  . Smokeless tobacco: Not on file  . Alcohol Use: No  . Drug Use: No  . Sexual Activity: Not on file   Other Topics Concern  . Not on file   Social History Narrative  No Known Allergies Review of Systems  Constitutional: Negative.  Negative for fever and fatigue.  HENT: Negative.   Respiratory: Negative.   Cardiovascular: Negative.   Gastrointestinal: Negative.   Endocrine: Positive for polydipsia, polyphagia and polyuria.  Genitourinary: Negative.   Musculoskeletal: Negative.   Skin: Negative.   Allergic/Immunologic: Negative.   Neurological: Negative.   Hematological: Negative.   Psychiatric/Behavioral:  Negative.       Objective:   Physical Exam  Constitutional: He is oriented to person, place, and time.  HENT:  Head: Normocephalic and atraumatic.  Right Ear: External ear normal. No tenderness. Tympanic membrane is scarred.  Left Ear: External ear normal. No tenderness. Tympanic membrane is scarred.  Mouth/Throat: Oropharynx is clear and moist and mucous membranes are normal. Abnormal dentition. Dental caries present.  TM cloudy, landmarks difficult to visualize  Eyes: Conjunctivae and EOM are normal. Pupils are equal, round, and reactive to light.  Neck: Normal range of motion. Neck supple.  Cardiovascular: Normal rate, normal heart sounds and intact distal pulses.   Pulmonary/Chest: Effort normal and breath sounds normal.  Abdominal: Soft. Bowel sounds are normal.  Musculoskeletal: Normal range of motion.  Neurological: He is alert and oriented to person, place, and time. He has normal reflexes.  Skin: Skin is warm and dry.  Psychiatric: He has a normal mood and affect. His behavior is normal. Judgment and thought content normal.      BP 117/76 mmHg  Pulse 67  Temp(Src) 97.5 F (36.4 C) (Oral)  Resp 16  Ht  (1.575 m)  Wt 127 lb (57.607 kg)  BMI 23.22 kg/m2 Assessment & Plan:  1. Type 2 diabetes mellitus without complication, without long-term current use of insulin (HCC) CBG was 433 on arrival. Patient given 10 units of Novolin to improve. CBG improved to 323 after receiving insulin. Patient educated on basic carbohydrate counting. He will need more education concerning diet and education regimen. Patient given  information on type 2 diabetes support group. Will adjust medication regimen after reviewing labs. The patient is asked to make an attempt to improve diet and exercise patterns to aid in medical management of this problem. - Glucose, capillary - Hemoglobin A1c - COMPLETE METABOLIC PANEL WITH GFR - Lipid Panel - POCT urinalysis dipstick - TSH - HIV antibody (with  reflex) - insulin regular (NOVOLIN R,HUMULIN R) 100 units/mL injection 10 Units; Inject 0.1 mLs (10 Units total) into the skin once. 2. Loss of weight - Glucose, capillary - Hemoglobin A1c - TSH - HIV antibody (with reflex)  3. Need for immunization against influenza  - Flu Vaccine QUAD 36+ mos IM (Fluarix)  4. Need for Tdap vaccination  - Tdap vaccine greater than or equal to 7yo IM   Hubbard,Kevin M, FNP The patient was given clear instructions to go to ER or return to medical center if symptoms do not improve, worsen or new problems develop. The patient verbalized understanding. Will notify patient with laboratory results. On ar

## 2015-09-02 ENCOUNTER — Other Ambulatory Visit: Payer: Self-pay | Admitting: Family Medicine

## 2015-09-02 DIAGNOSIS — E119 Type 2 diabetes mellitus without complications: Secondary | ICD-10-CM

## 2015-09-02 MED ORDER — INSULIN SYRINGES (DISPOSABLE) U-100 0.3 ML MISC: 1.0000 | Freq: Three times a day (TID) | Status: DC

## 2015-09-02 MED ORDER — INSULIN GLARGINE 100 UNIT/ML ~~LOC~~ SOLN: 10.0000 [IU] | Freq: Every day | SUBCUTANEOUS | Status: DC

## 2015-09-02 MED ORDER — GLUCOSE BLOOD VI STRP
ORAL_STRIP | Status: DC
Start: 2015-09-02 — End: 2017-10-31

## 2015-09-02 MED ORDER — TRUE METRIX AIR GLUCOSE METER W/DEVICE KIT
1.0000 | PACK | Freq: Three times a day (TID) | Status: DC
Start: 2015-09-02 — End: 2021-07-21

## 2015-09-03 NOTE — Progress Notes (Signed)
Called and advised patient of changes in medication. To stop Metformin and start lantus 10 units every night. Advised patient to check blood sugars 3 times daily before meals. Asked patient to bring glucose meter with him for his appointment on October 01, 2015 and make sure he keeps this appointment.Thanks!

## 2015-10-01 ENCOUNTER — Ambulatory Visit: Payer: Self-pay | Admitting: Family Medicine

## 2015-10-20 ENCOUNTER — Ambulatory Visit (INDEPENDENT_AMBULATORY_CARE_PROVIDER_SITE_OTHER): Payer: Self-pay | Admitting: Family Medicine

## 2015-10-20 ENCOUNTER — Other Ambulatory Visit: Payer: Self-pay | Admitting: Family Medicine

## 2015-10-20 ENCOUNTER — Ambulatory Visit (HOSPITAL_COMMUNITY)
Admission: RE | Admit: 2015-10-20 | Discharge: 2015-10-20 | Disposition: A | Discharge status: Home / Self Care | Payer: Self-pay | Source: Ambulatory Visit | Attending: Family Medicine | Admitting: Family Medicine

## 2015-10-20 VITALS — BP 120/71 | HR 78 | Temp 97.9°F | Resp 16 | Ht 62.0 in | Wt 126.0 lb

## 2015-10-20 DIAGNOSIS — R739 Hyperglycemia, unspecified: Secondary | ICD-10-CM

## 2015-10-20 DIAGNOSIS — E119 Type 2 diabetes mellitus without complications: Secondary | ICD-10-CM

## 2015-10-20 DIAGNOSIS — R809 Proteinuria, unspecified: Secondary | ICD-10-CM

## 2015-10-20 LAB — POCT URINALYSIS DIP (DEVICE)
Bilirubin Urine: NEGATIVE
Glucose, UA: 500 mg/dL — AB
Ketones, ur: 15 mg/dL — AB
Leukocytes, UA: NEGATIVE
NITRITE: NEGATIVE
PH: 6 (ref 5.0–8.0)
PROTEIN: NEGATIVE mg/dL
Specific Gravity, Urine: 1.01 (ref 1.005–1.030)
UROBILINOGEN UA: 0.2 mg/dL (ref 0.0–1.0)

## 2015-10-20 LAB — GLUCOSE, CAPILLARY
GLUCOSE-CAPILLARY: 587 mg/dL — AB (ref 65–99)
GLUCOSE-CAPILLARY: 287 mg/dL — AB (ref 65–99)

## 2015-10-20 MED ORDER — SODIUM CHLORIDE 0.9 % IV BOLUS (SEPSIS)
500.0000 mL | Freq: Once | INTRAVENOUS | Status: DC
Start: 2015-10-20 — End: 2015-10-20

## 2015-10-20 MED ORDER — SODIUM CHLORIDE 0.9 % IV SOLN
Freq: Once | INTRAVENOUS | Status: AC
  Administered 2015-10-20: 12:00:00 via INTRAVENOUS

## 2015-10-20 MED ORDER — LISINOPRIL 2.5 MG PO TABS: 2.5000 mg | ORAL_TABLET | Freq: Every day | ORAL | Status: DC

## 2015-10-20 MED ORDER — GLYBURIDE 2.5 MG PO TABS: 2.5000 mg | ORAL_TABLET | Freq: Every day | ORAL | Status: DC

## 2015-10-20 MED ORDER — INSULIN GLARGINE 100 UNIT/ML ~~LOC~~ SOLN: 10.0000 [IU] | Freq: Every day | SUBCUTANEOUS | Status: DC

## 2015-10-20 MED ORDER — METFORMIN HCL 1000 MG PO TABS: 1000.0000 mg | ORAL_TABLET | Freq: Two times a day (BID) | ORAL | Status: DC

## 2015-10-20 MED ORDER — INSULIN REGULAR HUMAN 100 UNIT/ML IJ SOLN
10.0000 [IU] | Freq: Once | INTRAMUSCULAR | Status: AC
  Administered 2015-10-20: 10 [IU] via SUBCUTANEOUS

## 2015-10-20 MED FILL — LISINOPRIL 2.5 MG TABLET: 2.5 | 30 days supply | Qty: 30 | Fill #0

## 2015-10-20 NOTE — Progress Notes (Signed)
Subjective:    Patient ID: Kevin Hubbard, male    DOB: 09/22/1960, 55 y.o.   MRN: [id number]  Diabetes He has type 2 (Patient states that he did not call in prescriptions and has not  had medications in 5 days) diabetes mellitus. His disease course has been worsening (Blood sugar was 587 on arrival). Pertinent negatives for hypoglycemia include no dizziness, headaches or nervousness/anxiousness. Associated symptoms include polydipsia, polyphagia and polyuria. Pertinent negatives for diabetes include no fatigue. Risk factors for coronary artery disease include diabetes mellitus. Current diabetic treatment includes diet. He is compliant with treatment some of the time. He is following a generally unhealthy diet. He has not had a previous visit with a dietitian. He rarely participates in exercise. His overall blood glucose range is >200 mg/dl. An ACE inhibitor/angiotensin II receptor blocker is not being taken. He does not see a podiatrist.Eye exam is not current.    Mr. Kevin RegisterJorge Berrey, a 55 year old male presents for a 1 month follow up of diabetes mellitus. Patient has does not have a primary provider and has primarily been using the emergency department for all primary needs. Patient reports that he is out of medictions. He has refills, but did not call pharmacy to Symptoms: hyperglycemia and weight loss. Symptoms have been intermittent. Currently complaining of weight loss,  polyuria, polyphagia, and polydipsia.  Patient denies hypoglycemia , nausea, paresthesia of the feet and visual disturbances.  Evaluation to date has been included: fasting blood sugar. Patient reports that he checks home sugars periodically. Treatment to date includes Metformin and Glyburide, with unsatisfactory results.    Past Medical History  Diagnosis Date  . Diabetes mellitus without complication [facility](HCC)    Past Surgical History  Procedure Laterality Date  . Abdominal surgery     Social History   Social History  .  Marital Status: Single    Spouse Name: N/A  . Number of Children: N/A  . Years of Education: N/A   Occupational History  . Not on file.   Social History Main Topics  . Smoking status: Former Smoker    Quit date: 07/17/2012  . Smokeless tobacco: Not on file  . Alcohol Use: No  . Drug Use: No  . Sexual Activity: Not on file   Other Topics Concern  . Not on file   Social History Narrative  No Known Allergies Review of Systems  Constitutional: Negative.  Negative for fever and fatigue.  HENT: Negative.   Respiratory: Negative.   Cardiovascular: Negative.   Gastrointestinal: Negative.   Endocrine: Positive for polydipsia, polyphagia and polyuria.  Genitourinary: Negative.   Musculoskeletal: Negative.   Skin: Negative.   Allergic/Immunologic: Negative.   Neurological: Negative.  Negative for dizziness and headaches.  Hematological: Negative.   Psychiatric/Behavioral: Negative.  The patient is not nervous/anxious.       Objective:   Physical Exam  Constitutional: He is oriented to person, place, and time.  HENT:  Head: Normocephalic and atraumatic.  Right Ear: External ear normal. No tenderness. Tympanic membrane is scarred.  Left Ear: External ear normal. No tenderness. Tympanic membrane is scarred.  Mouth/Throat: Oropharynx is clear and moist and mucous membranes are normal. Abnormal dentition. Dental caries present.  TM cloudy, landmarks difficult to visualize  Eyes: Conjunctivae and EOM are normal. Pupils are equal, round, and reactive to light.  Neck: Normal range of motion. Neck supple.  Cardiovascular: Normal rate, normal heart sounds and intact distal pulses.   Pulmonary/Chest: Effort normal and breath sounds  normal.  Abdominal: Soft. Bowel sounds are normal.  Musculoskeletal: Normal range of motion.  Neurological: He is alert and oriented to person, place, and time. He has normal reflexes.  Skin: Skin is warm and dry.  Psychiatric: He has a normal mood and  affect. His behavior is normal. Judgment and thought content normal.      BP 120/71 mmHg  Pulse 78  Temp(Src) 97.9 F (36.6 C) (Oral)  Resp 16  Ht  (1.575 m)  Wt 126 lb (57.153 kg)  BMI 23.04 kg/m2 Assessment & Plan:  1. Type 2 diabetes mellitus without complication, without long-term current use of insulin (HCC) CBG was 587 on arrival. Patient given 10 units of Novolin to improve. CBG improved to 287 after receiving insulin. Patient also received a 500 ml saline Bolus in the day infusion center.  There have been some definite non adherance issues here. I have discussed with him the great importance of following the treatment plan exactly as directed in order to achieve a good medical outcome.Patient educated on basic carbohydrate counting. He will need more education concerning diet and education regimen. Patient given information on type 2 diabetes support group. He and I also discussed the importance of calling prescription refills to pharmacy. He states that he did not understand that concept before today.  The patient is asked to make an attempt to improve diet and exercise patterns to aid in medical management of this problem.  - metFORMIN (GLUCOPHAGE) 1000 MG tablet; Take 1 tablet (1,000 mg total) by mouth 2 (two) times daily with a meal.  Dispense: 60 tablet; Refill: 1 - glyBURIDE (DIABETA) 2.5 MG tablet; Take 1 tablet (2.5 mg total) by mouth daily with breakfast.  Dispense: 30 tablet; Refill: 1 - insulin glargine (LANTUS) 100 UNIT/ML injection; Inject 0.1 mLs (10 Units total) into the skin at bedtime.  Dispense: 10 mL; Refill: 11 - Lipid Panel; Future - Hemoglobin A1c; Future - COMPLETE METABOLIC PANEL WITH GFR; Future - POCT urinalysis dipstick - lisinopril (ZESTRIL) 2.5 MG tablet; Take 1 tablet (2.5 mg total) by mouth daily.  Dispense: 30 tablet; Refill: 2 - insulin regular (NOVOLIN R,HUMULIN R) 100 units/mL injection 10 Units; Inject 0.1 mLs (10 Units total) into the skin  once. - Glucose, capillary - POCT urinalysis dip (device)  2. Proteinuria  - Microalbumin/Creatinine Ratio, Urine  RTC: 1 month for type 2 diabetes mellitus  Jadi Deyarmin M, FNP The patient was given clear instructions to go to ER or return to medical center if symptoms do not improve, worsen or new problems develop. The patient verbalized understanding. Will notify patient with laboratory results. On ar

## 2015-10-20 NOTE — Progress Notes (Signed)
Ordering Provider: Julianne HandlerLaChina Hollis, NP Procedure: IV infusion of normal saline; POCT Diagnosis: Hyperglycemia (790.29)  Pt tolerated well; IV removed with no complications noted; NP made aware of pt's blood glucose post infusion; pt alert, oriented, and ambulatory upon discharge

## 2015-10-21 ENCOUNTER — Encounter: Payer: Self-pay | Admitting: Family Medicine

## 2015-10-21 ENCOUNTER — Other Ambulatory Visit: Payer: Self-pay

## 2015-10-21 DIAGNOSIS — E119 Type 2 diabetes mellitus without complications: Secondary | ICD-10-CM

## 2015-10-21 LAB — MICROALBUMIN / CREATININE URINE RATIO
Creatinine, Urine: 23 mg/dL (ref 20–370)
MICROALB UR: 0.6 mg/dL
MICROALB/CREAT RATIO: 26 ug/mg{creat} (ref <30)

## 2015-10-21 MED ORDER — METFORMIN HCL 1000 MG PO TABS: 1000.0000 mg | ORAL_TABLET | Freq: Two times a day (BID) | ORAL | Status: DC

## 2015-10-21 MED FILL — metFORMIN HCL 1000 MG TABS: 1000 | 30 days supply | Qty: 60 | Fill #0

## 2015-10-21 NOTE — Patient Instructions (Signed)
Recuento bsico de carbohidratos para la diabetes mellitus (Basic Carbohydrate Counting for Diabetes Mellitus) El recuento de carbohidratos es un mtodo destinado a calcular la cantidad de carbohidratos en la dieta. El consumo de carbohidratos aumenta naturalmente el nivel de azcar (glucosa) en la sangre, por lo que es importante que sepa la cantidad que debe incluir en cada comida. El recuento de carbohidratos ayuda a mantener el nivel de glucosa en la sangre dentro de los lmites normales. La cantidad permitida de carbohidratos es diferente para cada persona. Un nutricionista puede ayudarlo a calcular la cantidad adecuada para usted. Una vez que sepa la cantidad de carbohidratos que puede consumir, podr calcular los carbohidratos de los alimentos que desea comer. Los siguientes alimentos incluyen carbohidratos:  Granos, como panes y cereales.  Frijoles secos y productos con soja.  Vegetales almidonados, como papas, guisantes y maz.  Frutas y jugos de frutas.  Leche y yogur.  Dulces y bocadillos, como pastel, galletas, caramelos, papas fritas de bolsa, refrescos y bebidas frutales con azcar. RECUENTO DE CARBOHIDRATOS Hay dos maneras de calcular los carbohidratos de los alimentos. Puede usar cualquiera de los dos mtodos o una combinacin de ambos. Leer la etiqueta de informacin nutricional de los alimentos envasados La informacin nutricional es una etiqueta incluida en casi todas las bebidas y los alimentos envasados de los [place]. Indica el tamao de la porcin de ese alimento o bebida e informacin sobre los nutrientes de cada porcin, incluso los gramos (g) de carbohidratos por porcin.  Decida la cantidad de porciones que comer o tomar de este alimento o bebida. Multiplique la cantidad de porciones por el nmero de gramos de carbohidratos indicados en la etiqueta para esa porcin. El total ser la cantidad de carbohidratos que consumir al comer ese alimento o tomar esa  bebida. Conocer las porciones estndar de los alimentos Cuando coma alimentos no envasados o que no incluyan la informacin nutricional en la etiqueta, deber medir las porciones para poder calcular la cantidad de carbohidratos. Una porcin de la mayora de los alimentos ricos en carbohidratos contiene alrededor de 15g de carbohidratos. La siguiente lista incluye los tamaos de porcin de los alimentos ricos en carbohidratos que contienen alrededor de 15g de carbohidratos por porcin:   1rebanada de pan (1oz) o 1tortilla de seis pulgadas.  panecillo de hamburguesa o bollito tipo ingls.  4a 6galletas.   de taza de cereal sin azcar y seco.   taza de cereal caliente.   de taza de arroz o pastas.  taza de pur de papas o de una papa grande al horno.  1taza de frutas frescas o una fruta pequea.  taza de frutas o jugo de frutas enlatados o congelados.  1 taza de leche.   de taza de yogur descremado sin ningn agregado o de yogur endulzado con edulcorante artificial.  taza de vegetales almidonados, como guisantes, maz o papas, o de frijoles secos cocidos. Decida la cantidad de porciones estndar que comer. Multiplique la cantidad de porciones por 15 (los gramos de carbohidratos en esa porcin). Por ejemplo, si come 2tazas de fresas, habr comido 2porciones y 30g de carbohidratos (2porciones x 15g = 30g). Para las comidas como sopas y guisos, en las que se mezcla ms de un alimento, deber contar los carbohidratos de cada alimento incluido. EJEMPLO DE RECUENTO DE CARBOHIDRATOS Ejemplo de cena  3 onzas de pechugas de pollo.   de taza de arroz integral.   taza de maz.  1 taza de leche.  1 taza de fresas   con crema batida sin azcar. Clculo de carbohidratos [place] 1: Identifique los alimentos que contienen carbohidratos:   Arroz.  Maz.  Leche.  Jinny Sanders. [place] 2: Calcule el nmero de porciones que consumir de cada uno:   2 porciones de  Surveyor, minerals.  1 porcin de maz.  1 porcin de leche.  1 porcin de fresas. [place] 3: Multiplique cada una de esas porciones por 15g:   2 porciones de arroz x 15 g = 30 g.  1 porcin de maz x 15 g = 15 g.  1 porcin de leche x 15 g = 15 g.  1 porcin de fresas x 15 g = 15 g. [place] 4: Sume todas las cantidades para Artist total de gramos de carbohidratos consumidos: 30 g + 15 g + 15 g + 15 g = 75 g.   Esta informacin no tiene Theme park manager el consejo del mdico. Asegrese de hacerle al mdico cualquier pregunta que tenga.   Document Released: 10/11/2011 Document Revised: 08/09/2014 Elsevier Interactive Patient Education [employer] Diabetes y actividad fsica (Diabetes and Exercise) Hacer actividad fsica con regularidad es muy importante. No se trata solo de [employer]. Tiene muchos otros beneficios, como por ejemplo:  Mejorar el estado fsico, la flexibilidad y la resistencia.  Aumenta la densidad sea.  Ayuda a Art gallery manager.  Disminuye la Art gallery manager.  Aumenta la fuerza muscular.  Reduce el estrs y las tensiones.  Mejora el estado de salud general. Las personas diabticas que realizan actividad fsica tienen beneficios adicionales debido al ejercicio:  Reduce el apetito.  El organismo mejora el uso del azcar (glucosa) de la [employer].  Ayuda a disminuir o Engineer, maintenance (IT).  Disminuye la presin arterial.  Ayuda a disminuir los lpidos en la sangre (colesterol y triglicridos).  El organismo mejora el uso de la insulina porque:  Aumenta la sensibilidad del organismo a la insulina.  Reduce las necesidades de insulina del organismo.  Disminuye el riesgo de enfermedad cardaca por la actividad fsica ya que  disminuye el colesterol y [employer] triglicridos.  Aumenta los niveles de colesterol bueno (como las lipoprotenas de alta densidad [HDL]) en el organismo.  Disminuye los niveles de glucosa en la [place]. SU PLAN  DE ACTIVIDAD  Elija una actividad que disfrute y establezca objetivos realistas. Para ejercitarse sin riesgos, debe comenzar a Education administrator cualquier actividad fsica nueva lentamente y aumentar la intensidad del ejercicio de forma gradual con el tiempo. Su mdico o educador en diabetes podrn ayudarlo a crear un plan de actividades que lo beneficie. Las recomendaciones generales incluyen lo siguiente:  Air cabin crew a los nios para que realicen al menos 60 minutos de actividad fsica Management consultant.  Estirarse y Education officer, environmental ejercicios de entrenamiento de la fuerza, como yoga o levantamiento de pesas, por lo menos 2 veces por semana.  Realizar en total por lo menos 150 minutos de ejercicios de intensidad moderada cada semana, como caminar a paso ligero o hacer gimnasia acutica.  Hacer ejercicio fsico por lo menos 3 das por semana y no dejar pasar ms de 2 das seguidos sin ejercitarse.  Evitar los perodos largos de inactividad (90 minutos o ms tiempo). Cuando deba pasar mucho tiempo sentado, haga pausas frecuentes para caminar o estirarse. RECOMENDACIONES PARA REALIZAR EJERCICIOS CUANDO SE TIENE DIABETES TIPO 1 O TIPO 2   Controle la glucosa en la sangre antes de comenzar. Si el nivel de glucosa en la sangre es de ms de 240 mg/dl, controle las  cetonas en la orina. No haga actividad fsica si hay cetonas.  Evite inyectarse insulina en las zonas del cuerpo que ejercitar. Por ejemplo, evite inyectarse insulina en:  Los brazos, si juega al tenis.  Las piernas, si corre.  Lleve un registro de:  Los alimentos que consume antes y despus de Tour manager.  Los momentos esperables de picos de accin de la insulina.  Los niveles de glucosa en la sangre antes y despus de hacer ejercicios.  El tipo y cantidad de [place] fsica que Biomedical engineer.  Revise los registros con su mdico. El mdico lo ayudar a Environmental education officer pautas para ajustar la cantidad de alimento y las cantidades de insulina antes y despus  de Radio producer ejercicios.  Si toma insulina o agentes hipoglucemiantes por va oral, observe si hay signos y sntomas de hipoglucemia. Entre los que se incluyen:  Mareos.  Temblores.  Sudoracin.  Escalofros.  Confusin.  Beba gran cantidad de agua mientras hace ejercicios para evitar la deshidratacin o los golpes de Airline pilot. Durante la actividad fsica se pierde agua corporal que se debe reponer.  Comente con su mdico antes de comenzar un programa de actividad fsica para verificar que sea seguro para usted. Recuerde, cualquier actividad es mejor que ninguna.   Esta informacin no tiene Theme park manager el consejo del mdico. Asegrese de hacerle al mdico cualquier pregunta que tenga.   Document Released: 08/08/2007 Document Revised: 12/03/2014 Elsevier Interactive Patient Education 2016 Elsevier Inc. Insulin Glargine injection Ladell Heads es este medicamento? La INSULINA GLARGINA es una insulina de origen [place]. Este medicamento disminuye la cantidad de Banker. Esta insulina es una insulina de accin prolongada, que normalmente se administra una vez al da. Este medicamento puede ser utilizado para otros usos; si tiene alguna pregunta consulte con su proveedor de atencin mdica o con su farmacutico. Qu le debo informar a mi profesional de la salud antes de tomar este medicamento? Necesita saber si usted presenta alguno de los siguientes problemas o situaciones: -episodios de hipoglucemia -enfermedad renal -enfermedad heptica -una reaccin alrgica o inusual a la insulina, al metacresol, a otros medicamentos, alimentos, colorantes o conservantes -si est embarazada o buscando quedar embarazada -si est amamantando a un beb Cmo debo utilizar este medicamento? Este medicamento es para inyeccin por va subcutnea. Utilice este medicamento a la misma Economist. Utilice exactamente como se le haya indicado. Nunca debe mezclar esta insulina en la misma jeringa con  otras insulinas antes de inyeccin. No agite vigorosamente la insulina antes de usar. Le ensearn cmo utilizar este medicamento y cmo [place] las dosis para actividades y [place]. No utilice ms insulina de la recetada. Siempre chequee el aspecto de su insulina antes de [place]. Este medicamento debe ser transparente y sin color como el agua. No lo utilice si est turbio, espeso, coloreado o si tiene partculas slidas. Es importante que deseche las agujas y las jeringas usadas en un recipiente resistente a los pinchazos. No las deseche en una basura. Si no tiene un recipiente resistente a los pinchazos, consulte a Film/video editor o su proveedor de atencin para obtenerlo. Hable con su pediatra para informarse acerca del uso de este medicamento en nios. Puede requerir atencin especial. Sobredosis: Pngase en contacto inmediatamente con un centro toxicolgico o una sala de urgencia si usted cree que haya tomado demasiado medicamento. ATENCIN: [employer] es solo para usted. No comparta este medicamento con nadie. Qu sucede si me olvido de una dosis? Es importante de no olvidar una dosis.  Su profesional de la salud o su mdico debe hablar con usted acerca de algn plan para dosis olvidadas. Si olvida una dosis, siga su plan. No use dosis dobles. Qu puede interactuar con este medicamento? -otros medicamentos para la diabetes Muchos medicamentos pueden aumentar o reducir Air cabin crew de Banker, tales como: -bebidas alcohlicas -aspirina y medicamentos tipo aspirina -cloranfenicol -cromo -diurticos -hormonas femeninas, como estrgenos, progestias o pldoras anticonceptivas -medicamentos para el corazn -isoniazida -hormonas masculinas o esteroides anablicos -medicamentos para bajar de peso -medicamentos para alergias, asma, resfros o tos -medicamentos para problemas mentales -medicamentos llamados inhibidores de la monoamina oxidasa (IMAO), tales como [place],  Parnate, Marplan, Eldepryl -niacina -AINE, medicamentos para el dolor y la inflamacin, tales como ibuprofeno o naproxeno -pentamidina -fenitona -probenecid -antibiticos quinolnicos, tales como ciprofloxacina, levofloxacina, ofloxacina -algunos suplementos diteticos a base de hierbas -medicamentos esteroideos como la prednisona o la cortisona -medicamentos tiroideos Algunos medicamentos pueden disimular los sntomas de advertencia de bajo nivel de Banker. Tendr que controlar atentamente su nivel de azcar en la sangre si est tomando algunos de stos medicamentos, tales como: -beta-bloqueantes, tales como atenolol, metoprolol, propranolol -clonidina -guanetidina -reserpina Puede ser que esta lista no menciona todas las posibles interacciones. Informe a su profesional de [employer] de [employer] productos a base de hierbas, medicamentos de [place] o suplementos nutritivos que est tomando. Si usted fuma, consume bebidas alcohlicas o si utiliza drogas ilegales, indqueselo tambin a su profesional de [employer]. Algunas sustancias pueden interactuar con su medicamento. A qu debo estar atento al usar [employer] Corporation? Visite a su mdico o a su profesional de la salud para chequear su evolucin peridicamente. No conduzca ni utilice maquinaria ni haga nada que Scientist, research (life sciences) en estado de alerta hasta que sepa cmo le afecta este medicamento. El alcohol puede interferir con el efecto de [place]. Evite las bebidas alcohlicas. Un examen llamado HbA1C (A1C) ser monitoreado. Es un simple examen de [place]. Mide su control de azcar en la sangre durante los ltimos 2 a 3 meses. Usted recibir Medtronic cada 3 a 6 meses. Aprenda cmo controlar el nivel de azcar en la sangre. Aprenda a reconocer los sntomas de bajo y alto nivel de azcar en la sangre y cmo tratarlos. Siempre lleve consigo una fuente rpida de azcar por si acaso experimenta sntomas de bajo nivel de  azcar en la sangre. Ejemplos incluyen caramelos duros o tabletas de glucosa. Asegrese de que los miembros de su familia sepan que se puede ahogar si come o bebe mientras tiene sntomas graves de bajo nivel de azcar en la sangre, tales como convulsiones o prdida del conocimiento. Deben obtener ayuda mdica inmediatamente. Informe a su mdico o a su profesional de la salud si tiene alto nivel de International aid/development worker. Tal vez sea necesario cambiar la dosis de su medicamento. Si est enfermo o haciendo mucho ms ejercicio que el habitual, puede ser necesario cambiar la dosis de su medicamento. No se salte comidas. Pregunte a su mdico o a su profesional de la salud si debe evitar el consumo de alcohol. Muchos productos de venta libre para tos y resfros contienen azcar y alcohol. Estos pueden Biochemist, clinical de azcar en la sangre. Asegrese que tiene la [place] correcta para el tipo de insulina que est utilizando. Trate de no [place] y tipo de [place] o [place] a menos que as lo indica su mdico o su profesional de Radiographer, therapeutic. El cambiar de [place] o tipos  puede causar un alto o bajo nivel peligroso de Bankerazcar en la sangre. Siempre lleve a mano un suministro adicional de Alortoninsulina, [place] Islandsjeringas y [place]agujas. Utilice la jeringa sola una vez. Deseche la Kyung Ruddjeringa y [place] d'Ivo[place]aguja en un envase cerrado para prevenir pinchazos accidentales de agujas. Nunca debe compartir las plumas y los cartuchos. Aun si cambia la aguja, compartiendo puede resultar en la propagacin de virus, tales como hepatitis o VIH. Use una pulsera o cadena de identificacin mdica. Lleve consigo una tarjeta de identificacin con informacin sobre su enfermedad y Engineer, manufacturing systemsdetalles de sus medicamentos y los horarios de las dosis. Qu efectos secundarios puedo tener al Boston Scientificutilizar este medicamento? Efectos secundarios que debe informar a su mdico o a Producer, television/film/videosu profesional de la salud tan pronto como sea posible: -Therapist, artreacciones alrgicas como erupcin cutnea, picazn o urticarias,  hinchazn de la cara, labios o lengua -problemas respiratorios -signos o sntomas de Albertson'salto nivel de azcar en la sangre tales como Commercial Pointmareos, boca seca, piel seca, aliento con [address] D[address]or a frutas, nuseas, dolores [address]estomacales, aumento de sed o apetito, frecuentes descargas de orina -signos o sntomas de bajo nivel de azcar en la sangre tales como sentirse ansioso, confusin, mareos, aumento de apetito, Warrior Runhambre, debilidad o cansancio inusual, sudoracin, temblores, fro, irritabilidad, dolor de cabeza, visin borrosa, pulso cardaco rpido, prdida del conocimiento Efectos secundarios que, por lo general, no requieren atencin mdica (debe informarlos a su mdico o su profesional de la salud si persisten o si son molestos): -aumento o disminucin de los tejidos de grasas debajo de la piel, debido a Insurance claims handlerusar demasiado un lugar de inyeccin en particular -picazn, ardor, hinchazn o erupcin en el lugar de inyeccin Puede ser que esta lista no menciona todos los posibles efectos secundarios. Comunquese a su mdico por asesoramiento mdico Hewlett-Packardsobre los efectos secundarios. Usted puede informar los efectos secundarios a la FDA por telfono al 1-800-FDA-1088. Dnde debo guardar mi medicina? Mantngala fuera del alcance de los nios. Guarde los frascos sin abrir en el refrigerador a una temperatura de [place] 2 y 8 grados C (36 y 5046 grados F). No la congele o utilice si la insulina ha [place]estado congelada. Los frascos abiertos (frascos que est utilizando actualmente) pueden guardarse en el refrigerador o a Publishing rights managertemperatura ambiente, aproximadamente a 25 grados C (77 grados F) o ms fresco. PharmacologistMantener su insulina a temperatura ambiente disminuye la cantidad de dolor que experimenta durante la inyeccin. Una vez abierta, la insulina se puede utilizar por [address] das. Despus de [address] das, deseche el frasco. Guarde las plumas de Lantus Solostar o plumas de Basaglar Kwik en el refrigerador a una temperatura de [place] 2 y 8 grados C (36 y 4746  grados F) o a Publishing rights managertemperatura ambiente de menos de 30 grados C (86 grados F). No la congele o utilice si la insulina ha [place]estado congelada. Una vez abiertas, las plumas se deben mantener a Publishing rights managertemperatura ambiente. No guardar en el refrigerador una vez abiertas. Una vez abierta, la insulina se puede utilizar durante [address] das. Despus de [address] das, debe desechar la pluma de Lantus Solostar o pluma de WardBasaglar Kwik. Guarde las plumas de Toujeo Solostar en el refrigerador a una temperatura de [place] 2 y 8 grados C (36 y 4046 grados F). No la congele o utilice si la insulina ha [place]estado congelada. Una vez abiertas, las plumas se deben [address]mantener a Marketing executivetemperatura ambiente a menos de 30 grados C (86 grados F). No guardar en el refrigerador una vez abiertas. Una vez abierta, la insulina se puede utilizar por 42  das. Despus de [address] debe desechar la pluma de Information systems manager. Proteger todos los frascos y las plumas de insulina de la luz y Nurse, children's. Deseche todo el medicamento sin usar despus de la fecha de vencimiento o despus de transcurrido el tiempo especfico de almacenamiento a Publishing rights manager. ATENCIN: Este folleto es un resumen. Puede ser que no cubra toda la posible informacin. Si usted tiene preguntas acerca de esta medicina, consulte con su mdico, su farmacutico o su profesional de Radiographer, therapeutic.    2016, Elsevier/Gold Standard. (2014-11-29 00:00:00)

## 2015-10-22 ENCOUNTER — Other Ambulatory Visit: Payer: Self-pay

## 2015-10-22 DIAGNOSIS — E119 Type 2 diabetes mellitus without complications: Secondary | ICD-10-CM

## 2015-10-22 MED ORDER — GLYBURIDE 2.5 MG PO TABS: 2.5000 mg | ORAL_TABLET | Freq: Every day | ORAL | Status: DC

## 2015-10-22 MED FILL — LANTUS 100 UNITS/ML VIAL: 100 | 90 days supply | Qty: 10 | Fill #0

## 2015-10-22 MED FILL — glyBURIDE 2.5 MG TABS: 2.5 | 30 days supply | Qty: 30 | Fill #0

## 2015-10-22 NOTE — Telephone Encounter (Signed)
glyburide sent into pharmacy. Thanks!

## 2015-11-19 MED FILL — ?METFORMIN HCL 1,000 MG TAB: 1000 | 30 days supply | Qty: 60 | Fill #1

## 2015-11-19 MED FILL — LANTUS 100 UNITS/ML VIAL: 100 | 28 days supply | Qty: 10 | Fill #1

## 2016-01-05 ENCOUNTER — Telehealth: Payer: Self-pay

## 2016-01-05 DIAGNOSIS — E119 Type 2 diabetes mellitus without complications: Secondary | ICD-10-CM

## 2016-01-05 MED ORDER — GLYBURIDE 2.5 MG PO TABS
2.5000 mg | ORAL_TABLET | Freq: Every day | ORAL | Status: DC
Start: 2016-01-05 — End: 2016-09-29

## 2016-01-05 MED ORDER — INSULIN GLARGINE 100 UNIT/ML ~~LOC~~ SOLN: 10.0000 [IU] | Freq: Every day | SUBCUTANEOUS | Status: DC

## 2016-01-05 MED FILL — LANTUS 100 UNITS/ML VIAL: 100 | 30 days supply | Qty: 10 | Fill #0

## 2016-01-05 MED FILL — glyBURIDE 2.5 MG TABS: 2.5 | 30 days supply | Qty: 30 | Fill #0

## 2016-01-05 NOTE — Telephone Encounter (Signed)
Refill for glyburide and lantus sent into pharmacy. Thanks!

## 2016-01-05 NOTE — Telephone Encounter (Signed)
Pt is requesting a medication refill for his Diabeta and Lantus. Pt is also requesting a call to notify him when he is able to pick his medication up. [phone] Thanks!

## 2016-01-06 ENCOUNTER — Other Ambulatory Visit: Payer: Self-pay | Admitting: Family Medicine

## 2016-01-06 MED FILL — ?METFORMIN HCL 1,000 MG TAB: 1000 | 30 days supply | Qty: 60 | Fill #0

## 2016-01-23 ENCOUNTER — Other Ambulatory Visit: Payer: Self-pay | Admitting: *Deleted

## 2016-01-23 DIAGNOSIS — E119 Type 2 diabetes mellitus without complications: Secondary | ICD-10-CM

## 2016-01-23 MED ORDER — INSULIN GLARGINE 100 UNITS/ML SOLOSTAR PEN: 10.0000 [IU] | PEN_INJECTOR | Freq: Every day | SUBCUTANEOUS | Status: DC

## 2016-01-23 NOTE — Telephone Encounter (Signed)
PASS PROGRAM 

## 2016-02-04 MED FILL — ULTICARE SYRIN 0.3 ML 29GX1: 29G X 1/2" | 25 days supply | Qty: 100 | Fill #0

## 2016-02-05 MED FILL — !LANTUS 100 UNITS/ML VIAL: 100 | 30 days supply | Qty: 10 | Fill #1

## 2016-02-16 MED FILL — ?METFORMIN HCL 1,000 MG TAB: 1000 | 30 days supply | Qty: 60 | Fill #1

## 2016-03-22 MED FILL — ?METFORMIN HCL 1,000 MG TAB: 1000 | 30 days supply | Qty: 60 | Fill #2

## 2016-03-22 MED FILL — !LANTUS 100 UNITS/ML VIAL: 100 | 90 days supply | Qty: 10 | Fill #2

## 2016-03-22 MED FILL — ULTICARE SYRIN 0.3 ML 29GX1: 29G X 1/2" | 25 days supply | Qty: 100 | Fill #1

## 2016-05-03 ENCOUNTER — Other Ambulatory Visit: Payer: Self-pay | Admitting: Family Medicine

## 2016-05-03 MED FILL — !LANTUS 100 UNITS/ML VIAL: 100 | 30 days supply | Qty: 10 | Fill #3

## 2016-05-03 MED FILL — metFORMIN HCL 1000 MG TABS: 1000 | 30 days supply | Qty: 60 | Fill #0

## 2016-06-29 MED FILL — metFORMIN HCL 1000 MG TABS: 1000 | 30 days supply | Qty: 60 | Fill #1

## 2016-08-03 MED FILL — metFORMIN HCL 1000 MG TABS: 1000 | 30 days supply | Qty: 60 | Fill #2

## 2016-09-06 ENCOUNTER — Other Ambulatory Visit: Payer: Self-pay | Admitting: Family Medicine

## 2016-09-10 ENCOUNTER — Other Ambulatory Visit: Payer: Self-pay | Admitting: Family Medicine

## 2016-09-13 MED FILL — ?METFORMIN HCL 1,000 MG TAB: 1000 | 30 days supply | Qty: 60 | Fill #0

## 2016-09-29 ENCOUNTER — Encounter: Payer: Self-pay | Admitting: Family Medicine

## 2016-09-29 ENCOUNTER — Ambulatory Visit (HOSPITAL_COMMUNITY)
Admission: RE | Admit: 2016-09-29 | Discharge: 2016-09-29 | Disposition: A | Discharge status: Home / Self Care | Payer: Self-pay | Source: Ambulatory Visit | Attending: Family Medicine | Admitting: Family Medicine

## 2016-09-29 ENCOUNTER — Other Ambulatory Visit: Payer: Self-pay | Admitting: Family Medicine

## 2016-09-29 ENCOUNTER — Ambulatory Visit (INDEPENDENT_AMBULATORY_CARE_PROVIDER_SITE_OTHER): Payer: Self-pay | Admitting: Family Medicine

## 2016-09-29 VITALS — BP 123/83 | HR 89 | Temp 97.5°F | Resp 18 | Wt 121.6 lb

## 2016-09-29 DIAGNOSIS — Z1159 Encounter for screening for other viral diseases: Secondary | ICD-10-CM

## 2016-09-29 DIAGNOSIS — Z794 Long term (current) use of insulin: Secondary | ICD-10-CM

## 2016-09-29 DIAGNOSIS — R739 Hyperglycemia, unspecified: Secondary | ICD-10-CM | POA: Insufficient documentation

## 2016-09-29 DIAGNOSIS — Z23 Encounter for immunization: Secondary | ICD-10-CM

## 2016-09-29 DIAGNOSIS — R81 Glycosuria: Secondary | ICD-10-CM

## 2016-09-29 DIAGNOSIS — E1165 Type 2 diabetes mellitus with hyperglycemia: Secondary | ICD-10-CM

## 2016-09-29 DIAGNOSIS — R7309 Other abnormal glucose: Secondary | ICD-10-CM

## 2016-09-29 LAB — COMPREHENSIVE METABOLIC PANEL
ALBUMIN: 4 g/dL (ref 3.5–5.0)
ALT: 22 U/L (ref 17–63)
AST: 23 U/L (ref 15–41)
Alkaline Phosphatase: 58 U/L (ref 38–126)
Anion gap: 10 (ref 5–15)
BILIRUBIN TOTAL: 0.9 mg/dL (ref 0.3–1.2)
BUN: 16 mg/dL (ref 6–20)
CHLORIDE: 105 mmol/L (ref 101–111)
CO2: 21 mmol/L — ABNORMAL LOW (ref 22–32)
CREATININE: 0.82 mg/dL (ref 0.61–1.24)
Calcium: 9.3 mg/dL (ref 8.9–10.3)
GFR calc Af Amer: >60 mL/min (ref >60)
GLUCOSE: 332 mg/dL — AB (ref 65–99)
POTASSIUM: 3.4 mmol/L — AB (ref 3.5–5.1)
Sodium: 136 mmol/L (ref 135–145)
Total Protein: 6.2 g/dL — ABNORMAL LOW (ref 6.5–8.1)

## 2016-09-29 LAB — CBC WITH DIFFERENTIAL/PLATELET
BASOS ABS: 0 10*3/uL (ref 0.0–0.1)
Basophils Relative: 0 %
Eosinophils Absolute: 0.1 10*3/uL (ref 0.0–0.7)
Eosinophils Relative: 1 %
HEMATOCRIT: 41.6 % (ref 39.0–52.0)
Hemoglobin: 15.3 g/dL (ref 13.0–17.0)
LYMPHS PCT: 20 %
Lymphs Abs: 1.4 10*3/uL (ref 0.7–4.0)
MCH: 31.2 pg (ref 26.0–34.0)
MCHC: 36.8 g/dL — ABNORMAL HIGH (ref 30.0–36.0)
MCV: 84.7 fL (ref 78.0–100.0)
Monocytes Absolute: 0.5 10*3/uL (ref 0.1–1.0)
Monocytes Relative: 7 %
NEUTROS ABS: 5 10*3/uL (ref 1.7–7.7)
NEUTROS PCT: 72 %
PLATELETS: 164 10*3/uL (ref 150–400)
RBC: 4.91 MIL/uL (ref 4.22–5.81)
RDW: 12.7 % (ref 11.5–15.5)
WBC: 7 10*3/uL (ref 4.0–10.5)

## 2016-09-29 LAB — POCT URINALYSIS DIP (DEVICE)
BILIRUBIN URINE: NEGATIVE
Glucose, UA: 500 mg/dL — AB
Hgb urine dipstick: NEGATIVE
Ketones, ur: NEGATIVE mg/dL
LEUKOCYTES UA: NEGATIVE
NITRITE: NEGATIVE
PH: 5.5 (ref 5.0–8.0)
Protein, ur: NEGATIVE mg/dL
Specific Gravity, Urine: 1.01 (ref 1.005–1.030)
UROBILINOGEN UA: 0.2 mg/dL (ref 0.0–1.0)

## 2016-09-29 LAB — POCT GLYCOSYLATED HEMOGLOBIN (HGB A1C): HEMOGLOBIN A1C: 13

## 2016-09-29 LAB — GLUCOSE, CAPILLARY: Glucose-Capillary: 308 mg/dL — ABNORMAL HIGH (ref 65–99)

## 2016-09-29 MED ORDER — INSULIN GLARGINE 100 UNIT/ML ~~LOC~~ SOLN: 20.0000 [IU] | Freq: Every day | SUBCUTANEOUS | 11 refills | Status: DC

## 2016-09-29 MED ORDER — SODIUM CHLORIDE 0.9 % IV SOLN
INTRAVENOUS | Status: DC
  Administered 2016-09-29: 12:00:00 via INTRAVENOUS

## 2016-09-29 MED ORDER — GLYBURIDE 5 MG PO TABS: 5.0000 mg | ORAL_TABLET | Freq: Every day | ORAL | 1 refills | Status: DC

## 2016-09-29 MED ORDER — INSULIN REGULAR HUMAN 100 UNIT/ML IJ SOLN
15.0000 [IU] | Freq: Once | INTRAMUSCULAR | Status: AC
  Administered 2016-09-29: 15 [IU] via SUBCUTANEOUS

## 2016-09-29 MED FILL — !LANTUS 100 UNITS/ML VIAL: 100 | 30 days supply | Qty: 10 | Fill #0

## 2016-09-29 NOTE — Progress Notes (Signed)
Subjective:    Patient ID: [name], male    DOB: 09/24/1960, 56 y.o.   MRN: [id number]  Diabetes  He has type 2 (Patient states that he did not call in prescriptions and has not  had medications in 5 days) diabetes mellitus. His disease course has been worsening (Blood sugar was greater than 600 on arrival). Pertinent negatives for hypoglycemia include no dizziness, headaches or nervousness/anxiousness. Associated symptoms include polydipsia, polyphagia and polyuria. Pertinent negatives for diabetes include no fatigue. Risk factors for coronary artery disease include diabetes mellitus. Current diabetic treatment includes diet. He is compliant with treatment some of the time. He is following a generally unhealthy diet. He has not had a previous visit with a dietitian. He rarely participates in exercise. His overall blood glucose range is >200 mg/dl. An ACE inhibitor/angiotensin II receptor blocker is not being taken. He does not see a podiatrist.Eye exam is not current.  Patient says that he has been taking medications consistently. He has been eating a great deal of fruit over the past several months. He says that he did not realize that fruit can elevate blood sugar level. He endorses weakness and blurred vision over the past several days. He did not bring glucometer on today.   Past Medical History:  Diagnosis Date  . Diabetes mellitus without complication [facility](HCC)    Past Surgical History:  Procedure Laterality Date  . ABDOMINAL SURGERY     Social History   Social History  . Marital status: Single    Spouse name: N/A  . Number of children: N/A  . Years of education: N/A   Occupational History  . Not on file.   Social History Main Topics  . Smoking status: Former Smoker    Quit date: 07/17/2012  . Smokeless tobacco: Not on file  . Alcohol use No  . Drug use: No  . Sexual activity: Not on file   Other Topics Concern  . Not on file   Social History Narrative  . No narrative  on file  No Known Allergies  Immunization History  Administered Date(s) Administered  . Influenza,inj,Quad PF,36+ Mos 09/01/2015  . Tdap 02/18/2015, 09/01/2015   Review of Systems  Constitutional: Negative.  Negative for fatigue and fever.  HENT: Negative.   Respiratory: Negative.   Cardiovascular: Negative.   Gastrointestinal: Negative.   Endocrine: Positive for polydipsia, polyphagia and polyuria.  Genitourinary: Negative.   Musculoskeletal: Negative.   Skin: Negative.   Allergic/Immunologic: Negative.   Neurological: Negative.  Negative for dizziness and headaches.  Hematological: Negative.   Psychiatric/Behavioral: Negative.  The patient is not nervous/anxious.       Objective:   Physical Exam  Constitutional: He is oriented to person, place, and time.  HENT:  Head: Normocephalic and atraumatic.  Right Ear: External ear normal. No tenderness. Tympanic membrane is scarred.  Left Ear: External ear normal. No tenderness. Tympanic membrane is scarred.  Mouth/Throat: Oropharynx is clear and moist and mucous membranes are normal. Abnormal dentition. Dental caries present.  TM cloudy, landmarks difficult to visualize  Eyes: Conjunctivae and EOM are normal. Pupils are equal, round, and reactive to light.  Neck: Normal range of motion. Neck supple.  Cardiovascular: Normal rate, normal heart sounds and intact distal pulses.   Pulmonary/Chest: Effort normal and breath sounds normal.  Abdominal: Soft. Bowel sounds are normal.  Musculoskeletal: Normal range of motion.  Neurological: He is alert and oriented to person, place, and time. He has normal reflexes.  Skin: Skin is  warm and dry.  Psychiatric: He has a normal mood and affect. His behavior is normal. Judgment and thought content normal.      BP 123/83 (BP Location: Left Arm, Patient Position: Sitting, Cuff Size: Normal)   Pulse 89   Temp 97.5 F (36.4 C) (Oral)   Resp 18   Wt 121 lb 9.6 oz (55.2 kg)   SpO2 99%   BMI  22.24 kg/m  Assessment & Plan:  1. Type 2 diabetes mellitus without complication, without long-term current use of insulin (HCC) CBG was greater than 600 on arrival. Patient given 10 units of Novolin to improve. Patient will transition to the day infusion center for a fluid bolus. He also received 15 units of Novolin R. There have been some definite non adherance issues here. I have discussed with him the great importance of following the treatment plan exactly as directed in order to achieve a good medical outcome.Patient educated on basic carbohydrate counting. He will need more education concerning diet and education regimen. Patient given information on type 2 diabetes support group.   The patient is asked to make an attempt to improve diet and exercise patterns to aid in medical management of this problem. - Glucose, capillary - HgB A1c - insulin glargine (LANTUS) 100 UNIT/ML injection; Inject 0.2 mLs (20 Units total) into the skin at bedtime.  Dispense: 10 mL; Refill: 11 - glyBURIDE (DIABETA) 5 MG tablet; Take 1 tablet (5 mg total) by mouth daily with breakfast.  Dispense: 90 tablet; Refill: 1 - POCT urinalysis dip (device) - Ambulatory referral to Ophthalmology - insulin regular (NOVOLIN R,HUMULIN R) 250 units/2.83mL (100 units/mL) injection 15 Units; Inject 0.15 mLs (15 Units total) into the skin once.  2. Hyperglycemia - insulin regular (NOVOLIN R,HUMULIN R) 250 units/2.52mL (100 units/mL) injection 15 Units; Inject 0.15 mLs (15 Units total) into the skin once.  3. Need for hepatitis C screening test - Hepatitis C antibody, reflex  4. Elevated serum glucose with glucosuria  - POCT urinalysis dip (device) - insulin regular (NOVOLIN R,HUMULIN R) 250 units/2.24mL (100 units/mL) injection 15 Units; Inject 0.15 mLs (15 Units total) into the skin once.  5. Immunization due - Pneumococcal polysaccharide vaccine 23-valent greater than or equal to 2yo subcutaneous/IM - Flu Vaccine QUAD 36+ mos  PF IM (Fluarix & Fluzone Quad PF)    Will reassess CBG following fluid bolus  RTC: 1 month for type 2 diabetes mellitus  Karess Harner M, FNP The patient was given clear instructions to go to ER or return to medical center if symptoms do not improve, worsen or new problems develop. The patient verbalized understanding. Will notify patient with laboratory results. On ar

## 2016-09-29 NOTE — Procedures (Signed)
[facility]  Procedure Note  Kevin Hubbard WUJ:[id number] DOB: 03/18/1961 DOA: 09/29/2016   PCP: Massie MaroonHollis,Lachina M, FNP   Associated Diagnosis: Hyperglycemia  Procedure Note: IV started, NS bolus given per order, labs drawn post infusion, CBG collected per order.   Condition During Procedure:  Patient stable at discharge   Condition at Discharge:  Patient verbalized understanding of discharge instructions.  Ambulatory at discharge.   Lanae BoastATUM, Allecia Bells, RN  [facility]

## 2016-09-29 NOTE — Discharge Instructions (Signed)
You received an infusion of of Normal Saline over 1 hour.  Your blood sugar after infusion was 308.  Please follow up with provider as discussed during your office visit.

## 2016-09-30 LAB — HEPATITIS C ANTIBODY: HCV AB: NEGATIVE

## 2016-10-12 MED FILL — ?METFORMIN HCL 1,000 MG TAB: 1000 | 30 days supply | Qty: 60 | Fill #1

## 2016-10-14 ENCOUNTER — Other Ambulatory Visit: Payer: Self-pay | Admitting: Family Medicine

## 2016-10-14 DIAGNOSIS — E119 Type 2 diabetes mellitus without complications: Secondary | ICD-10-CM

## 2016-10-27 ENCOUNTER — Ambulatory Visit (INDEPENDENT_AMBULATORY_CARE_PROVIDER_SITE_OTHER): Payer: Self-pay | Admitting: Family Medicine

## 2016-10-27 ENCOUNTER — Encounter: Payer: Self-pay | Admitting: Family Medicine

## 2016-10-27 VITALS — BP 114/70 | HR 72 | Temp 98.7°F | Ht 62.0 in | Wt 125.0 lb

## 2016-10-27 DIAGNOSIS — E119 Type 2 diabetes mellitus without complications: Secondary | ICD-10-CM

## 2016-10-27 LAB — LIPID PANEL
CHOL/HDL RATIO: 3 ratio (ref <5.0)
CHOLESTEROL: 180 mg/dL (ref <200)
HDL: 61 mg/dL (ref >40)
LDL Cholesterol: 75 mg/dL (ref <100)
Triglycerides: 220 mg/dL — ABNORMAL HIGH (ref <150)
VLDL: 44 mg/dL — AB (ref <30)

## 2016-10-27 LAB — POCT URINALYSIS DIP (DEVICE)
Bilirubin Urine: NEGATIVE
GLUCOSE, UA: 500 mg/dL — AB
Hgb urine dipstick: NEGATIVE
Ketones, ur: NEGATIVE mg/dL
Leukocytes, UA: NEGATIVE
Nitrite: NEGATIVE
PH: 6.5 (ref 5.0–8.0)
PROTEIN: NEGATIVE mg/dL
Specific Gravity, Urine: 1.01 (ref 1.005–1.030)
UROBILINOGEN UA: 0.2 mg/dL (ref 0.0–1.0)

## 2016-10-27 LAB — GLUCOSE, CAPILLARY: GLUCOSE-CAPILLARY: 330 mg/dL — AB (ref 65–99)

## 2016-10-27 MED ORDER — INSULIN ASPART 100 UNIT/ML FLEXPEN: 5.0000 [IU] | PEN_INJECTOR | Freq: Three times a day (TID) | SUBCUTANEOUS | 11 refills | Status: DC

## 2016-10-27 NOTE — Patient Instructions (Addendum)
Recommend Biotene rinse for mouth Novolog will inject 5 units with breakfast, lunch, and dinner. Hold if blood sugar is <150 Continue Lantus 20 units at night Eat a carbohydrate modified diet Control del nivel sanguneo de glucosa en los adultos (Blood Glucose Monitoring, Adult) El control del nivel de azcar (glucosa) en la sangre lo ayuda a tener la diabetes bajo control. Tambin ayuda a que usted y el mdico controlen si el tratamiento de la diabetes es Engineer, manufacturingeficaz. El control del nivel sanguneo de glucosa implica realizar controles regulares como lo indique el mdico y Midwifellevar registro de los resultados (registro diario). POR QU DEBO CONTROLAR EL NIVEL SANGUNEO DE GLUCOSA? Si controla su nivel sanguneo de glucosa con regularidad, podr:  Comprender de United Stationersqu manera los alimentos, la actividad fsica, las enfermedades y los medicamentos inciden en los niveles sanguneos de Garlandglucosa.  Conocer el nivel sanguneo de glucosa en cualquier momento dado. Saber rpidamente si el nivel es bajo (hipoglucemia) o alto (hiperglucemia).  Puede ser de ayuda para que usted y el mdico sepan cmo ajustar los medicamentos. CUNDO DEBO CONTROLAR EL NIVEL SANGUNEO DE GLUCOSA? Siga las indicaciones del mdico acerca de la frecuencia con la que debe controlar el nivel sanguneo de glucosa. La frecuencia puede depender de:  El tipo de diabetes que tenga.  Si su diabetes est bajo control.  Los medicamentos que toma. Si usted tiene diabetes tipo 1:  Controle su nivel sanguneo de glucosa al Rite Aidmenos dos veces al da.  Tambin controle su nivel sanguneo de glucosa:  Antes de cada inyeccin de insulina.  Antes y despus de hacer ejercicio.  Entre las comidas.  Dos horas despus de una comida.  Ocasionalmente, entre las 2:00a.m. y las 3:00a.m., como se lo hayan indicado.  Antes de Education officer, environmentalrealizar tareas peligrosas, Sport and exercise psychologistcomo manejar o usar maquinaria pesada.  A la hora de acostarse.  Es posible que Engineer, manufacturingdeba  controlar con ms frecuencia los niveles sanguneos de Milwaukeeglucosa, Kellogghasta 6 a 10veces por da:  Si Bots[place]ausa una bomba de Marked Treeinsulina.  Si necesita varias inyecciones diarias.  Si su diabetes no est bien controlada.  Si est enfermo.  Si tiene antecedentes de hipoglucemia grave.  Si tiene antecedentes de no darse cuenta cundo est bajando su nivel sanguneo de glucosa (hipoglucemia asintomtica). Si usted tiene diabetes tipo 2:  Si recibe insulina u otro medicamento para la diabetes, controle el nivel sanguneo de glucosa al Borders Groupmenos dos veces al da.  Mientras reciba tratamiento intensivo con insulina, debe medirse el nivel sanguneo de glucosa al menos 4veces al C.H. Robinson Worldwideda. Ocasionalmente, es posible que deba controlarse entre las 2:00a.m. y las 3:00a.m., segn se lo indiquen.  Tambin controle su nivel sanguneo de glucosa:  Antes y despus de hacer ejercicio.  Antes de Education officer, environmentalrealizar tareas peligrosas, Sport and exercise psychologistcomo manejar o usar maquinaria pesada.  Es posible que Chemical engineerdeba controlar con ms frecuencia los niveles sanguneos de glucosa si:  Es necesario ajustar la dosis de sus medicamentos.  Su diabetes no est bien controlada.  Est enfermo. QU ES UN REGISTRO DIARIO DEL NIVEL JarrattSANGUNEO DE GLUCOSA?  Un registro diario es un registro de los valores de glucosa en la [place]angre. Puede ayudarles a usted y a su mdico a:  Air traffic controllerBuscar patrones en su nivel sanguneo de glucosa durante el transcurso del Escalontiempo.  Ajustar su plan de control de la diabetes como sea necesario.  Cada vez que controle su nivel sanguneo de glucosa, anote el resultado y aquellos factores que pueden estar afectando su nivel sanguneo de glucosa, como la dieta  y la actividad fsica Dietitian.  La Harley-Davidson de los medidores de glucosa guardan un registro de las lecturas realizadas con el medidor. Algunos permiten descargar sus registros en una computadora. CMO ME CONTROLO EL NIVEL SANGUNEO DE GLUCOSA? Siga los siguientes pasos para  obtener lecturas precisas de su glucemia: Materiales necesarios  Medidor de Horticulturist, commercial.  Tiras reactivas para el medidor. Cada medidor tiene sus propias tiras reactivas. Debe usar las tiras reactivas que trae su medidor.  Una aguja para pincharse el dedo (lanceta). No utilice la misma lanceta en ms de una ocasin.  Un dispositivo que sujeta la lanceta (dispositivo de puncin).  Un diario o libro de anotaciones para [employer]. Procedimiento  BorgWarner con agua y [place].  Pnchese el costado del dedo (no la punta) con Optometrist. Use un dedo diferente cada vez.  Frote suavemente el dedo General Mills aparezca una pequea gota de [place].  Siga las instrucciones que vienen con el medidor para Public affairs consultant tira Firefighter, Contractor la sangre sobre la tira y usar el medidor de Horticulturist, commercial.  Registre el resultado y las observaciones que desee. Zonas del cuerpo alternativas para realizar las pruebas  Algunos medidores le permiten tomar sangre para la prueba de otras zonas del cuerpo que no son el dedo (zonas alternativas).  Si cree que tiene hipoglucemia o si tiene hipoglucemia asintomtica, no utilice las zonas alternativas del cuerpo. En su lugar, use los dedos.  Es posible que las zonas alternativas no sean tan precisas como los dedos porque el flujo de sangre es ms lento en esas zonas. Esto significa que el resultado que obtiene de estas zonas puede estar retrasado y ser un poco diferente del resultado que obtendra del dedo.  Los sitios alternativos ms comunes son los siguientes:  Los antebrazos.  Los muslos.  La palma de la mano. Consejos adicionales  Siempre tenga los insumos a mano.  Todos los medidores de glucosa incluyen un nmero de telfono "directo", disponible las 24 horas, al que podr llamar si tiene preguntas o [place]. Tambin puede consultar a su mdico.  Despus de usar algunas cajas de tiras reactivas, ajuste (calibre) el  medidor de glucemia segn las instrucciones del medidor. Esta informacin no tiene Theme park manager el consejo del mdico. Asegrese de hacerle al mdico cualquier pregunta que tenga. Document Released: 07/19/2005 Document Revised: 11/10/2015 Document Reviewed: 12/29/2015 Elsevier Interactive Patient Education  2017 Elsevier Inc.  Diabetes mellitus y actividad fsica (Diabetes Mellitus and Exercise) Hacer actividad fsica habitualmente es importante para el estado de salud general, en especial si tiene diabetes (diabetes mellitus). La actividad fsica no solo se reduce a Psychiatric nurse. Aporta muchos beneficios para la salud, como aumentar la fuerza muscular y la densidad sea, y reducir las grasas corporales y Development worker, community. Esto mejora el estado fsico, la flexibilidad y la resistencia, y todo ello redunda en un mejor estado de salud general. La actividad fsica tiene beneficios adicionales para los diabticos, entre ellos:  Disminuye el apetito.  Ayuda a bajar y Photographer glucemia bajo control.  Baja la presin arterial.  Ayuda a controlar las cantidades de sustancias grasas (lpidos) en la [place], como el colesterol y los triglicridos.  Mejora la respuesta del cuerpo a la insulina (optimizacin de la sensibilidad a la insulina).  Reduce la cantidad de insulina que el cuerpo necesita.  Reduce el riesgo de sufrir cardiopata coronaria de la siguiente forma:  Campbell Soup  de colesterol y triglicridos.  Aumenta los niveles de colesterol bueno.  Disminuye la glucemia. QU ES EL PLAN DE ACTIVIDADES? El mdico o un educador para la diabetes certificado pueden ayudarlo a Teacher, English as a foreign language del tipo y de la frecuencia de actividad fsica (plan de actividades) adecuado para usted. Asegrese de lo siguiente:  Haga por lo menos semanales de ejercicios de intensidad moderada o vigorosa. Estos podran ser caminatas dinmicas, ciclismo o [place].  Haga  ejercicios de elongacin y de fortalecimiento, como yoga o levantamiento de pesas, por lo menos 2veces por semana.  Reparta la actividad en al menos 3das de la semana.  Haga algn tipo de actividad fsica [employer].  No deje pasar ms de 2das seguidos sin hacer algn tipo de actividad fsica.  No permanezca inactivo durante ms de seguidos. Tmese descansos frecuentes para caminar o estirarse.  Elija un tipo de ejercicio o de actividad que disfrute y establezca objetivos realistas.  Comience lentamente y aumente de [place] gradual la intensidad del ejercicio con el correr del [place]. QU DEBO SABER ACERCA DEL CONTROL DE LA DIABETES?  Contrlese la glucemia antes y despus de ejercitarse.  Si la glucemia supera los 240mg /dl (16,1WRUE/A) antes de ejercitarse, hgase un control de la orina para detectar la presencia de cetonas. Si tiene Federal-Mogul orina, no se ejercite hasta que la glucemia se normalice.  Conozca los sntomas de la glucemia baja (hipoglucemia) y aprenda cmo tratarla. El riesgo de tener hipoglucemia [place] durante y despus de hacer actividad fsica. Los sntomas frecuentes de hipoglucemia pueden incluir los siguientes:  [place].  Ansiedad.  Sudoracin y piel hmeda.  Confusin.  Mareos o sensacin de desvanecimiento.  Aumento de la frecuencia cardaca o palpitaciones.  Visin borrosa.  Hormigueo o adormecimiento alrededor Public Service Enterprise Group, los labios o la [place].  Temblores o sacudidas.  Irritabilidad.  Tenga una colacin de carbohidratos de accin rpida disponible antes, durante y despus de ejercitarse, a fin de evitar o tratar la hipoglucemia.  Evite inyectarse insulina en las zonas del cuerpo que ejercitar. Por ejemplo, evite inyectarse insulina en:  Los brazos, si juega al tenis.  Las piernas, si corre.  Lleve registros de sus hbitos de actividad fsica. Esto puede ayudarlos a usted y al mdico a Retail banker de control de  la diabetes segn sea necesario. Escriba los siguientes datos:  Los alimentos que consume antes y despus de Radio producer actividad fsica.  Los niveles de glucosa en la sangre antes y despus de hacer ejercicios.  El tipo y cantidad de [place] fsica que Biomedical engineer.  Cuando se prev que la insulina alcance su valor mximo, si [place] insulina. No haga actividad fsica en los momentos en que insulina alcanza su valor mximo.  Cuando comience un ejercicio o una actividad nuevos, trabaje con el mdico para asegurarse de que la actividad sea segura para usted y para Academic librarian la [place], los medicamentos o la ingesta de alimentos segn sea necesario.  Beba gran cantidad de agua mientras hace ejercicios para evitar la deshidratacin o los golpes de Airline pilot. Beba suficiente lquido para Photographer orina clara o de color amarillo plido. Esta informacin no tiene Theme park manager el consejo del mdico. Asegrese de hacerle al mdico cualquier pregunta que tenga. Document Released: 08/08/2007 Document Revised: 11/10/2015 Document Reviewed: 12/29/2015 Elsevier Interactive Patient Education  2017 Elsevier Inc.  Diabetes y cuidados del pie (Diabetes and Foot Care) La diabetes puede ser la causa de que el flujo sanguneo (  circulacin) en las piernas y los pies sea deficiente. Debido a esto, la piel de los pies se torna ms delgada, se rompe con facilidad y se cura ms lentamente. La piel puede estar seca, despellejarse y Lobbyist. Tambin pueden estar daados los nervios de las piernas y de los pies lo que provoca una disminucin de la sensibilidad. Es posible que no advierta heridas ms pequeas en los pies, que pueden causar infecciones graves. Cuidar sus pies es una de las cosas ms importantes que puede hacer por usted mismo. INSTRUCCIONES PARA EL CUIDADO EN EL HOGAR  Use siempre calzado, an dentro de su casa. No camine descalzo. Caminar descalzo facilita que se lastime.  Controle sus pies diariamente para  observar ampollas, cortes y enrojecimiento. Si no puede ver la planta del pie, use un espejo o pdale ayuda a Engineer, maintenance (IT).  Lave sus pies con agua tibia (no use agua caliente) y un [place]. Seque bien sus pies, y la zona [employer] dedos dando [place], hasta que estn completamente secos. Noremoje los pies, ya que esto puede resecar la piel.  Aplique una locin hidratante o vaselina (que no contenga alcohol ni perfume) en los pies y en las uas secas y [place]. No aplique locin entre los dedos.  Recorte las uas en forma recta. No escarbe debajo de las uas o alrededor [employer] Mills. Lime los bordes de las uas con una lima o esmeril.  No corte las durezas o callosidades, ni trate de quitarlas con medicamentos.  Use calcetines de algodn o medias [place] [employer]. Asegrese de que no le PACCAR Inc. Nouse calcetines que le lleguen a las rodillas, ya que podran disminuir el flujo de sangre a las piernas.  Use zapatos de cuero que le queden bien y que sean acolchados. Para amoldar los zapatos, clcelos slo algunas horas por da. Esto evitar lesiones en los pies. Revise siempre los zapatos antes de ponerlos para asegurarse de que no haya objetos en su interior.  No cruce las piernas. Esto puede disminuir el flujo de sangre a los pies.  Si algo le ha raspado, cortado o lastimado la piel de los pies, mantenga la piel de esa zona limpia y [place]. Debe higienizar estas zonas con agua y un jabn suave. No limpie la zona con agua oxigenada, alcohol ni yodo.  Cuando se quite un vendaje adhesivo, asegrese de no daar la piel.  Si tiene una herida, obsrvela varias veces por da para asegurarse de que se est curando.  No use bolsas de agua caliente ni almohadillas trmicas. Podran causar quemaduras. Si ha perdido la sensibilidad en los pies o las piernas, no sabr lo que le est sucediendo hasta que sea demasiado tarde.  Asegrese de que su mdico le haga un examen completo  de los pies por lo menos una vez al ao, o con ms frecuencia si usted tiene Caremark Rx. Informe todos los cortes, llagas o moretones a su mdico inmediatamente. SOLICITE ATENCIN MDICA SI:  Tiene una lesin que no se cura.  Tiene cortes o rajaduras en la piel.  Tiene una ua encarnada.  Nota una zona irritada en las piernas o los pies.  Siente una sensacin de ardor u hormigueo en las piernas o los pies.  Siente dolor o calambres en las piernas o los pies.  Las piernas o los pies estn adormecidos.  Siente los pies siempre fros. SOLICITE ATENCIN MDICA DE INMEDIATO SI:  Presenta enrojecimiento, hinchazn o aumento del dolor en Neomia Dear  herida.  Nota una lnea roja que sube por pierna.  Aparece pus en la herida.  Le sube la fiebre o segn lo que le indique el mdico.  Advierte un olor ftido que proviene de una lcera o una herida. Esta informacin no tiene Theme park manager el consejo del mdico. Asegrese de hacerle al mdico cualquier pregunta que tenga. Document Released: 07/19/2005 Document Revised: 11/10/2015 Document Reviewed: 12/26/2012 Elsevier Interactive Patient Education  2017 Elsevier Inc. La diabetes mellitus y los alimentos (Diabetes Mellitus and Food) Es importante que controle su nivel de azcar en la sangre (glucosa). El nivel de glucosa en sangre depende en gran medida de lo que usted come. Comer alimentos saludables en las cantidades [place] a lo largo del Futures trader, aproximadamente a la misma hora [employer], lo ayudar a Chief Operating Officer su nivel de Event organiser. Tambin puede ayudarlo a retrasar o Fish farm manager de la diabetes mellitus. Comer de Regions Financial Corporation saludable incluso puede ayudarlo a Event organiser de presin arterial y a Barista o Pharmacologist un peso saludable. Entre las recomendaciones generales para alimentarse y Water quality scientist los alimentos de forma saludable, se incluyen las siguientes:  Respetar las comidas principales y comer  colaciones con regularidad. Evitar pasar largos perodos sin comer con el fin de perder peso.  Seguir una dieta que consista principalmente en alimentos de origen vegetal, como frutas, vegetales, frutos secos, legumbres y cereales integrales.  Utilizar mtodos de coccin a baja temperatura, como hornear, en lugar de mtodos de coccin a alta temperatura, como frer en abundante aceite. Trabaje con el nutricionista para aprender a Acupuncturist nutricional de las etiquetas de los alimentos. CMO PUEDEN AFECTARME LOS ALIMENTOS? Carbohidratos Los carbohidratos afectan el nivel de glucosa en sangre ms que cualquier otro tipo de alimento. El nutricionista lo ayudar a Chief Strategy Officer cuntos carbohidratos puede consumir en cada comida y ensearle a contarlos. El recuento de carbohidratos es importante para mantener la glucosa en sangre en un nivel saludable, en especial si utiliza insulina o toma determinados medicamentos para la diabetes mellitus. Alcohol El alcohol puede provocar disminuciones sbitas de la glucosa en sangre (hipoglucemia), en especial si utiliza insulina o toma determinados medicamentos para la diabetes mellitus. La hipoglucemia es una afeccin que puede poner en peligro la vida. Los sntomas de la hipoglucemia (somnolencia, mareos y Administrator) son similares a los sntomas de haber consumido mucho alcohol. Si el mdico lo autoriza a beber alcohol, hgalo con moderacin y siga estas pautas:  Las mujeres no deben beber ms de un trago por da, y los hombres no deben beber ms de dos tragos por Futures trader. Un trago es igual a:  12 onzas (355 ml) de cerveza  5 onzas de vino (150 ml) de vino  1,5onzas (45ml) de bebidas espirituosas  No beba con el estmago vaco.  Mantngase hidratado. Beba agua, gaseosas dietticas o t helado sin azcar.  Las gaseosas comunes, los jugos y otros refrescos podran contener muchos carbohidratos y se Heritage manager. QU ALIMENTOS NO SE  RECOMIENDAN? Cuando haga las elecciones de alimentos, es importante que recuerde que todos los alimentos son distintos. Algunos tienen menos nutrientes que otros por porcin, aunque podran tener la misma cantidad de caloras o carbohidratos. Es difcil darle al cuerpo lo que necesita cuando consume alimentos con menos nutrientes. Estos son algunos ejemplos de alimentos que debera evitar ya que contienen muchas caloras y carbohidratos, pero pocos nutrientes:  Neurosurgeon trans (la mayora de los alimentos procesados incluyen grasas trans en la etiqueta de Informacin  nutricional).  Gaseosas comunes.  Jugos.  Caramelos.  Dulces, como tortas, pasteles, rosquillas y [place].  Comidas fritas. QU ALIMENTOS PUEDO COMER? Consuma alimentos ricos en nutrientes, que nutrirn el cuerpo y lo mantendrn saludable. Los alimentos que debe comer tambin dependern de varios factores, como:  Las caloras que necesita.  Los medicamentos que toma.  Su peso.  El nivel de glucosa en [place].  El [place] de presin arterial.  El nivel de colesterol. Debe consumir una amplia variedad de alimentos, por ejemplo:  Protenas.  Cortes de Target Corporation.  Protenas con bajo contenido de grasas saturadas, como pescado, clara de huevo y frijoles. Evite las carnes procesadas.  Frutas y vegetales.  Frutas y Sports administrator que pueden ayudar a Chief Operating Officer los niveles sanguneos de [place], como [place], mangos y batatas.  Productos lcteos.  Elija productos lcteos sin grasa o con bajo contenido de [place], como [place], yogur y [place].  Cereales, panes, pastas y arroz.  Elija cereales integrales, como panes multicereales, avena en grano y arroz integral. Estos alimentos pueden ayudar a controlar la presin arterial.  Rosalin Hawking.  Alimentos que contengan grasas saludables, como frutos secos, Chartered certified accountant, aceite de [place], aceite de canola y pescado. TODOS LOS QUE PADECEN DIABETES MELLITUS TIENEN EL MISMO PLAN DE  COMIDAS? Dado que todas las personas que padecen diabetes mellitus son distintas, no hay un solo plan de comidas que funcione para todos. Es muy importante que se rena con un nutricionista que lo ayudar a crear un plan de comidas adecuado para usted. Esta informacin no tiene Theme park manager el consejo del mdico. Asegrese de hacerle al mdico cualquier pregunta que tenga. Document Released: 10/26/2007 Document Revised: 08/09/2014 Document Reviewed: 06/15/2013 Elsevier Interactive Patient Education  2017 [employer].  Consejos para comer fuera de su casa si tiene diabetes (Tips for Eating Away From Home If You Have Diabetes) El control del nivel de glucemia, que tambin se conoce como azcar en la [place], puede ser un reto, que se complica an ms cuando uno no prepara sus propias comidas. Los siguientes consejos pueden ayudarlo a Chief Operating Officer la diabetes cuando come fuera de su casa. PLANIFICACIN Organcese si sabe que comer fuera de su casa:  Pregntele al mdico cmo sincronizar las comidas y el medicamento si est en tratamiento con insulina.  Haga una lista de restaurantes cercanos que ofrezcan opciones saludables. Si tienen un men que pueda leer en su casa, llvelo y planifique lo que pedir con anticipacin.  Busque informacin en lnea del restaurante donde quiera comer. Muchos restaurantes de comida rpida y cadenas de restaurantes incluyen la informacin nutricional en lnea. Tenga en cuenta esta informacin para elegir las opciones ms saludables y calcular los carbohidratos de la comida.  Use un libro de recuento de carbohidratos o una aplicacin mvil para fijarse en el contenido de carbohidratos y el tamao de porcin de lo que desea comer.  Comience a Armed forces training and education officer de las porciones y a Public house manager cuntas porciones hay en una unidad. Esto le permitir calcular la cantidad de carbohidratos que puede comer. ALIMENTOS LIBRES Un "alimento libre" es cualquier alimento o  bebida que contenga menos de 5g de carbohidratos por porcin. Entre los alimentos libres, se incluyen los siguientes:  Muchos vegetales.  Huevos duros.  Nueces o semillas.  Aceitunas.  Quesos.  Carnes. Estos tipos de alimentos son buenas opciones de bocadillos y en general estn disponibles en los bufs de ensaladas. Como aderezos "libres" para [place], puede usarse jugo de limn, vinagre o un aderezo de [place]  caloras (con menos de 20caloras por porcin). OPCIONES PARA REDUCIR LOS CARBOHIDRATOS  Reemplace el yogur descremado endulzado por el yogur sin azcar. Tambin puede consumir yogur a base de [place], pero es conveniente una opcin sin azcar o natural, porque tiene menos contenido de carbohidratos.  Pdale al mozo que retire la canasta de pan o las papas de la mesa.  Pida frutas frescas. El buf de ensaladas a menudo ofrece frutas frescas. Evite las frutas enlatadas, ya que por lo general tienen azcar o almbar.  Pida una ensalada y cmala sin aderezo. Tambin puede crear un aderezo "libre" para ensaladas.  Pida que le [employer] Media alimentos. Por ejemplo, en lugar de papas fritas, pida una porcin de vegetales, como una ensalada, judas verdes o brcoli. OTROS CONSEJOS  Si [place] insulina, adminstrela una vez que la comida llegue a la mesa, as las Automotive engineer.  Pregntele al mozo sobre el tamao de la porcin antes de pedir la comida y, si la porcin es ms grande de lo que usted debe consumir, pida una caja para llevarse la comida a su casa. Cuando llegue la comida, deje en el plato la cantidad que debe comer y coloque el resto en la caja para llevar.  Considere la posibilidad de Agricultural consultant un plato principal con alguien y de pedir una ensalada como guarnicin. Esta informacin no tiene Theme park manager el consejo del mdico. Asegrese de hacerle al mdico cualquier pregunta que tenga. Document Released: 07/19/2005 Document Revised: 11/10/2015  Document Reviewed: 10/16/2013 Elsevier Interactive Patient Education  2017 [employer].

## 2016-10-27 NOTE — Progress Notes (Signed)
Subjective:    Patient ID: Kevin Hubbard, male    DOB: 1960/10/06, 56 y.o.   MRN: [id number]  Diabetes  He has type 2 (Patient states that he has been taking Lantus consistently before bed. ) diabetes mellitus. His disease course has been worsening (Blood sugar was 330 on arrival). Pertinent negatives for hypoglycemia include no dizziness, headaches or nervousness/anxiousness. Associated symptoms include polyuria. Pertinent negatives for diabetes include no fatigue. Risk factors for coronary artery disease include diabetes mellitus. Current diabetic treatment includes diet. He is compliant with treatment some of the time. He is following a generally unhealthy diet. He has not had a previous visit with a dietitian. He rarely participates in exercise. His overall blood glucose range is >200 mg/dl. An ACE inhibitor/angiotensin II receptor blocker is being taken. He does not see a podiatrist.Eye exam is not current (Patient unable to get eye exam due to financial constraints).  Patient says that he has been taking medications consistently. He has been eating a great deal of fruit over the past several months. He says that he did not realize that fruit can elevate blood sugar level. He endorses weakness and blurred vision over the past several days. He did not bring glucometer on today.   Past Medical History:  Diagnosis Date  . Diabetes mellitus without complication [facility])    Past Surgical History:  Procedure Laterality Date  . ABDOMINAL SURGERY     Social History   Social History  . Marital status: Single    Spouse name: N/A  . Number of children: N/A  . Years of education: N/A   Occupational History  . Not on file.   Social History Main Topics  . Smoking status: Former Smoker    Quit date: 07/17/2012  . Smokeless tobacco: Never Used  . Alcohol use No  . Drug use: No  . Sexual activity: Not on file   Other Topics Concern  . Not on file   Social History Narrative  . No narrative on  file  No Known Allergies  Immunization History  Administered Date(s) Administered  . Influenza,inj,Quad PF,36+ Mos 09/01/2015, 09/29/2016  . Pneumococcal Polysaccharide-23 09/29/2016  . Tdap 02/18/2015, 09/01/2015   Review of Systems  Constitutional: Negative.  Negative for fatigue and fever.  HENT: Negative.   Respiratory: Negative.   Cardiovascular: Negative.   Gastrointestinal: Negative.   Endocrine: Positive for polyuria.  Genitourinary: Negative.   Musculoskeletal: Negative.   Skin: Negative.   Allergic/Immunologic: Negative.   Neurological: Negative.  Negative for dizziness and headaches.  Hematological: Negative.   Psychiatric/Behavioral: Negative.  The patient is not nervous/anxious.       Objective:   Physical Exam  Constitutional: He is oriented to person, place, and time.  HENT:  Head: Normocephalic and atraumatic.  Right Ear: External ear normal. No tenderness. Tympanic membrane is scarred.  Left Ear: External ear normal. No tenderness. Tympanic membrane is scarred.  Mouth/Throat: Oropharynx is clear and moist and mucous membranes are normal. Abnormal dentition. Dental caries present.  TM cloudy, landmarks difficult to visualize  Eyes: Conjunctivae and EOM are normal. Pupils are equal, round, and reactive to light.  Neck: Normal range of motion. Neck supple.  Cardiovascular: Normal rate, normal heart sounds and intact distal pulses.   Pulmonary/Chest: Effort normal and breath sounds normal.  Abdominal: Soft. Bowel sounds are normal.  Musculoskeletal: Normal range of motion.  Neurological: He is alert and oriented to person, place, and time. He has normal reflexes.  Skin: Skin is  warm and dry.  Psychiatric: He has a normal mood and affect. His behavior is normal. Judgment and thought content normal.      BP 114/70 (BP Location: Right Arm, Patient Position: Sitting, Cuff Size: Small)   Pulse 72   Temp 98.7 F (37.1 C) (Oral)   Ht 5\' 2"  (1.575 m)   Wt 125 lb  (56.7 kg)   SpO2 100%   BMI 22.86 kg/m  Assessment & Plan:  Type 2 diabetes mellitus without complication, without long-term current use of insulin (HCC) CBG was 330 on arrival. Patient will start Novolog 5 units for meal coverage. Discussed the importance of check blood sugar levels prior to meals. Will continue Lantus 20 units HS. Marland Kitchen. Patient given 10 units of Novolin to improve.  There have been some definite non adherance issues here. I have discussed with him the great importance of following the treatment plan exactly as directed in order to achieve a good medical outcome.  Patient educated on basic carbohydrate counting. He will need more education concerning diet and education regimen. Patient given information on type 2 diabetes support group.   The patient is asked to make an attempt to improve diet and exercise patterns to aid in medical management of this problem. - Glucose, capillary - insulin aspart (NOVOLOG FLEXPEN) 100 UNIT/ML FlexPen; Inject 5 Units into the skin 3 (three) times daily with meals.  Dispense: 15 mL; Refill: 11 - Lipid Panel - Hemoglobin A1c - POCT urinalysis dip (device)   RTC: 1 month for DMII  Emrys Mckamie Rennis PettyMoore Hilarie Sinha  MSN, FNP-C [facility] [address]  [place], [place] [id number] [phone]

## 2016-10-28 LAB — HEMOGLOBIN A1C
HEMOGLOBIN A1C: 13.2 % — AB (ref <5.7)
MEAN PLASMA GLUCOSE: 332 mg/dL

## 2016-11-16 MED FILL — glyBURIDE 5 MG TABS: 5 | 30 days supply | Qty: 30 | Fill #0

## 2016-11-16 MED FILL — NOVOLOG FLEXPEN SYRINGE: 100 | 20 days supply | Qty: 3 | Fill #0

## 2016-11-16 MED FILL — TRUEPLUS SYR 0.3ML 30GX5/16: 30G X 5/16" | 25 days supply | Qty: 100 | Fill #0

## 2016-11-22 MED FILL — metFORMIN HCL 1000 MG TABS: 1000 | 30 days supply | Qty: 60 | Fill #2

## 2016-12-01 ENCOUNTER — Ambulatory Visit: Payer: Self-pay | Admitting: Family Medicine

## 2016-12-28 MED FILL — glyBURIDE 5 MG TABS: 5 | 30 days supply | Qty: 30 | Fill #1

## 2017-01-03 MED FILL — TRUEPLUS SYR 0.3ML 30GX5/16: 30G X 5/16" | 25 days supply | Qty: 100 | Fill #1

## 2017-01-10 ENCOUNTER — Other Ambulatory Visit: Payer: Self-pay

## 2017-01-10 MED ORDER — METFORMIN HCL 1000 MG PO TABS: ORAL_TABLET | ORAL | 2 refills | Status: DC

## 2017-01-10 MED FILL — NOVOLOG FLEXPEN SYRINGE: 100 | 20 days supply | Qty: 3 | Fill #1

## 2017-01-10 MED FILL — LANTUS 100 UNITS/ML VIAL: 100 | 28 days supply | Qty: 10 | Fill #1

## 2017-01-10 MED FILL — ?METFORMIN HCL 1,000 MG TAB: 1000 | 30 days supply | Qty: 60 | Fill #0

## 2017-02-14 MED FILL — ?METFORMIN HCL 1,000 MG TAB: 1000 | 30 days supply | Qty: 60 | Fill #1

## 2017-03-16 MED FILL — ?METFORMIN HCL 1,000 MG TAB: 1000 | 30 days supply | Qty: 60 | Fill #2

## 2017-04-14 MED FILL — NOVOLOG FLEXPEN SYRINGE: 100 | 20 days supply | Qty: 3 | Fill #2

## 2017-04-14 MED FILL — LANTUS 100 UNITS/ML VIAL: 100 | 28 days supply | Qty: 10 | Fill #2

## 2017-04-25 ENCOUNTER — Other Ambulatory Visit: Payer: Self-pay | Admitting: Family Medicine

## 2017-05-18 MED FILL — ?METFORMIN HCL 1,000 MG TAB: 1000 | 30 days supply | Qty: 60 | Fill #0

## 2017-06-29 MED FILL — ?METFORMIN HCL 1,000 MG TAB: 1000 | 30 days supply | Qty: 60 | Fill #1

## 2017-08-17 MED FILL — ?METFORMIN HCL 1,000 MG TAB: 1000 | 30 days supply | Qty: 60 | Fill #2

## 2017-10-14 ENCOUNTER — Other Ambulatory Visit: Payer: Self-pay | Admitting: Family Medicine

## 2017-10-14 DIAGNOSIS — Z794 Long term (current) use of insulin: Principal | ICD-10-CM

## 2017-10-14 DIAGNOSIS — E1165 Type 2 diabetes mellitus with hyperglycemia: Secondary | ICD-10-CM

## 2017-10-14 MED FILL — NOVOLOG FLEXPEN SYRINGE: 100 | 20 days supply | Qty: 3 | Fill #3

## 2017-10-20 ENCOUNTER — Other Ambulatory Visit: Payer: Self-pay | Admitting: Family Medicine

## 2017-10-31 ENCOUNTER — Encounter: Payer: Self-pay | Admitting: Family Medicine

## 2017-10-31 ENCOUNTER — Ambulatory Visit (INDEPENDENT_AMBULATORY_CARE_PROVIDER_SITE_OTHER): Payer: Self-pay | Admitting: Family Medicine

## 2017-10-31 ENCOUNTER — Other Ambulatory Visit: Payer: Self-pay | Admitting: Family Medicine

## 2017-10-31 VITALS — BP 119/81 | HR 85 | Temp 97.7°F | Resp 16 | Ht 62.0 in | Wt 119.0 lb

## 2017-10-31 DIAGNOSIS — E0800 Diabetes mellitus due to underlying condition with hyperosmolarity without nonketotic hyperglycemic-hyperosmolar coma (NKHHC): Secondary | ICD-10-CM

## 2017-10-31 DIAGNOSIS — Z789 Other specified health status: Secondary | ICD-10-CM | POA: Insufficient documentation

## 2017-10-31 DIAGNOSIS — Z794 Long term (current) use of insulin: Secondary | ICD-10-CM

## 2017-10-31 DIAGNOSIS — E1165 Type 2 diabetes mellitus with hyperglycemia: Secondary | ICD-10-CM

## 2017-10-31 LAB — POCT URINALYSIS DIP (DEVICE)
BILIRUBIN URINE: NEGATIVE
Glucose, UA: 500 mg/dL — AB
HGB URINE DIPSTICK: NEGATIVE
Ketones, ur: NEGATIVE mg/dL
Leukocytes, UA: NEGATIVE
NITRITE: NEGATIVE
PH: 5.5 (ref 5.0–8.0)
Protein, ur: NEGATIVE mg/dL
Specific Gravity, Urine: 1.01 (ref 1.005–1.030)
UROBILINOGEN UA: 0.2 mg/dL (ref 0.0–1.0)

## 2017-10-31 LAB — GLUCOSE, POCT (MANUAL RESULT ENTRY)
POC GLUCOSE: 292 mg/dL — AB (ref 70–99)
POC Glucose: 243 mg/dl — AB (ref 70–99)
POC Glucose: 272 mg/dl — AB (ref 70–99)
POC Glucose: 211 mg/dl — AB (ref 70–99)

## 2017-10-31 LAB — POCT GLYCOSYLATED HEMOGLOBIN (HGB A1C): HEMOGLOBIN A1C: 13.8

## 2017-10-31 MED ORDER — INSULIN GLARGINE 100 UNIT/ML ~~LOC~~ SOLN: 30.0000 [IU] | Freq: Every day | SUBCUTANEOUS | 11 refills | Status: DC

## 2017-10-31 MED ORDER — NON FORMULARY
10.0000 [IU] | Freq: Once | Status: AC
  Administered 2017-10-31: 10 [IU] via SUBCUTANEOUS

## 2017-10-31 MED ORDER — INSULIN ASPART 100 UNIT/ML FLEXPEN
6.0000 [IU] | PEN_INJECTOR | Freq: Three times a day (TID) | SUBCUTANEOUS | 11 refills | Status: DC
Start: 2017-10-31 — End: 2018-03-31

## 2017-10-31 MED ORDER — GLUCOSE BLOOD VI STRP: ORAL_STRIP | 12 refills | Status: DC

## 2017-10-31 MED ORDER — "INSULIN SYRINGE-NEEDLE U-100 29G X 1/2"" 0.3 ML MISC": 5 refills | Status: DC

## 2017-10-31 MED ORDER — METFORMIN HCL 1000 MG PO TABS: ORAL_TABLET | ORAL | 5 refills | Status: DC

## 2017-10-31 MED ORDER — LISINOPRIL 2.5 MG PO TABS: 2.5000 mg | ORAL_TABLET | Freq: Every day | ORAL | 5 refills | Status: DC

## 2017-10-31 MED FILL — TRUE METRIX TEST STRIP: 30 days supply | Qty: 100 | Fill #0

## 2017-10-31 MED FILL — NOVOLOG FLEXPEN SYRINGE: 100 | 33 days supply | Qty: 6 | Fill #0

## 2017-10-31 MED FILL — LANTUS 100 UNITS/ML VIAL: 100 | 28 days supply | Qty: 10 | Fill #0

## 2017-10-31 MED FILL — LISINOPRIL 2.5 MG TABLET: 2.5 | 30 days supply | Qty: 30 | Fill #0

## 2017-10-31 MED FILL — metFORMIN HCL 1000 MG TABS: 1000 | 30 days supply | Qty: 60 | Fill #0

## 2017-10-31 NOTE — Progress Notes (Signed)
Subjective:    Patient ID: Kevin Hubbard, male    DOB: 01/26/1961, 57 y.o.   MRN: [id number] Kevin Hubbard, a 57 year old male with a history of uncontrolled type 2 DM presents for follow up. Patient has been lost to follow up for greater than 1 year. He primarily speaks spanish, utilizing video interpreter to assist with communication. Patient has not been following a carbohydrate modified diet and has been out of medications for months.   Diabetes  Pertinent negatives for hypoglycemia include no dizziness, headaches, nervousness/anxiousness, speech difficulty, sweats or tremors. Associated symptoms include blurred vision, polyuria and weight loss. Pertinent negatives for diabetes include no chest pain, no fatigue, no polydipsia, no polyphagia and no weakness. Pertinent negatives for diabetic complications include no CVA or heart disease. Risk factors for coronary artery disease include diabetes mellitus and male sex. When asked about current treatments, none were reported. He is compliant with treatment some of the time. He is following a generally unhealthy diet. He has not had a previous visit with a dietitian. He never participates in exercise. An ACE inhibitor/angiotensin II receptor blocker is not being taken. He does not see a podiatrist.Eye exam is not current.      Past Medical History:  Diagnosis Date  . Diabetes mellitus without complication [facility])    Past Surgical History:  Procedure Laterality Date  . ABDOMINAL SURGERY     Social History   Socioeconomic History  . Marital status: Single    Spouse name: Not on file  . Number of children: Not on file  . Years of education: Not on file  . Highest education level: Not on file  Occupational History  . Not on file  Social Needs  . Financial resource strain: Not on file  . Food insecurity:    Worry: Not on file    Inability: Not on file  . Transportation needs:    Medical: Not on file    Non-medical: Not on file  Tobacco  Use  . Smoking status: Former Smoker    Last attempt to quit: 07/17/2012    Years since quitting: 5.2  . Smokeless tobacco: Never Used  Substance and Sexual Activity  . Alcohol use: No  . Drug use: No  . Sexual activity: Not on file  Lifestyle  . Physical activity:    Days per week: Not on file    Minutes per session: Not on file  . Stress: Not on file  Relationships  . Social connections:    Talks on phone: Not on file    Gets together: Not on file    Attends religious service: Not on file    Active member of club or organization: Not on file    Attends meetings of clubs or organizations: Not on file    Relationship status: Not on file  . Intimate partner violence:    Fear of current or ex partner: Not on file    Emotionally abused: Not on file    Physically abused: Not on file    Forced sexual activity: Not on file  Other Topics Concern  . Not on file  Social History Narrative  . Not on file  No Known Allergies  Immunization History  Administered Date(s) Administered  . Influenza,inj,Quad PF,6+ Mos 09/01/2015, 09/29/2016  . Pneumococcal Polysaccharide-23 09/29/2016  . Tdap 02/18/2015, 09/01/2015   Review of Systems  Constitutional: Positive for weight loss. Negative for fatigue and fever.  HENT: Negative.   Eyes: Positive for blurred  vision.  Respiratory: Negative.   Cardiovascular: Negative.  Negative for chest pain.  Gastrointestinal: Negative.   Endocrine: Positive for polyuria. Negative for polydipsia and polyphagia.  Genitourinary: Negative.   Musculoskeletal: Negative.   Skin: Negative.   Allergic/Immunologic: Negative.   Neurological: Negative.  Negative for dizziness, tremors, speech difficulty, weakness and headaches.  Hematological: Negative.   Psychiatric/Behavioral: Negative.  The patient is not nervous/anxious.       Objective:   Physical Exam  Constitutional: He is oriented to person, place, and time.  HENT:  Head: Normocephalic and  atraumatic.  Right Ear: External ear normal. No tenderness. Tympanic membrane is scarred.  Left Ear: External ear normal. No tenderness. Tympanic membrane is scarred.  Mouth/Throat: Oropharynx is clear and moist and mucous membranes are normal. Abnormal dentition. Dental caries present.  TM cloudy, landmarks difficult to visualize  Eyes: Pupils are equal, round, and reactive to light. Conjunctivae and EOM are normal.  Neck: Normal range of motion. Neck supple.  Cardiovascular: Normal rate, normal heart sounds and intact distal pulses.  Pulmonary/Chest: Effort normal and breath sounds normal.  Abdominal: Soft. Bowel sounds are normal.  Musculoskeletal: Normal range of motion.  Neurological: He is alert and oriented to person, place, and time. He has normal reflexes.  Skin: Skin is warm and dry.  Psychiatric: He has a normal mood and affect. His behavior is normal. Judgment and thought content normal.      BP 119/81 (BP Location: Left Arm, Patient Position: Sitting, Cuff Size: Normal)   Pulse 85   Temp 97.7 F (36.5 C) (Oral)   Resp 16   Ht 5\' 2"  (1.575 m)   Wt 119 lb (54 kg)   SpO2 100%   BMI 21.77 kg/m  Assessment & Plan:  Type 2 diabetes mellitus without complication, without long-term current use of insulin (HCC) CBG was 292 on arrival. Also, hemoglobin a1C is elevated at 13.8, goal is < 7.    Patient will start Novolog 6 units for meal coverage. Discussed the importance of check blood sugar levels prior to meals. Will start Lantus 30 units HS.  There have been some definite non adherance issues here. I have discussed with him the great importance of following the treatment plan exactly as directed in order to achieve a good medical outcome.  Patient educated on basic carbohydrate counting. He will need more education concerning diet and education regimen. Patient given information on type 2 diabetes support group.   The patient is asked to make an attempt to improve diet and  exercise patterns to aid in medical management of this problem.  Administered 10 units of Humalog. CBG improved. Advised to start prescribed medications on today.  - Glucose (CBG) - HgB A1c - POCT urinalysis dip (device) - insulin glargine (LANTUS) 100 UNIT/ML injection; Inject 0.3 mLs (30 Units total) into the skin at bedtime.  Dispense: 10 mL; Refill: 11 - metFORMIN (GLUCOPHAGE) 1000 MG tablet; TAKE 1 TABLET BY MOUTH 2 TIMES DAILY WITH A MEAL.  Dispense: 60 tablet; Refill: 5 - lisinopril (ZESTRIL) 2.5 MG tablet; Take 1 tablet (2.5 mg total) by mouth daily.  Dispense: 30 tablet; Refill: 5 - insulin aspart (NOVOLOG FLEXPEN) 100 UNIT/ML FlexPen; Inject 6 Units into the skin 3 (three) times daily with meals.  Dispense: 15 mL; Refill: 11 - glucose blood (TRUE METRIX BLOOD GLUCOSE TEST) test strip; Use as instructed  Dispense: 100 each; Refill: 12 - Insulin Syringe-Needle U-100 (ULTICARE INSULIN SYRINGE) 29G X 1/2" 0.3 ML MISC; USE  AS DIRECTED 4 TIMES DAILY WITH MEALS AND AT BEDTIME  Dispense: 100 each; Refill: 5 - NON FORMULARY 10 Units - Comprehensive metabolic panel - Lipid Panel - Glucose (CBG) - Glucose (CBG) - Glucose (CBG) - NON FORMULARY 10 Units   Language barrier to communication Primarily speaks spanish, utilizing video interpreter to assist with communication. Also, patient instructions translated to spanish.    RTC: 6 weeks for DMII   Nolon Nations  MSN, FNP-C Patient Care [facility] [address] [place], [place] [address] [phone]

## 2017-10-31 NOTE — Patient Instructions (Addendum)
Your blood sugar is not controlled on current medication regimen.  It is due to noncompliance and not getting your refills appropriately.  All medications sent in have refills on the bottles so make sure that when your supply is getting low you notify your pharmacy for another order of that medication. The following is your blood sugar regimen: In the mornings upon awakening, you will check your blood sugar.  If blood sugar is less than 70 hold insulin.  If blood sugar is greater than 70, inject 6 units of insulin into your abdomen.  3 hours later you will check your blood sugar and if blood sugar is greater than 70 you will inject 6 units of insulin for meal coverage.  3-4 hours later prior to your dinner you will check your blood sugar and again if it is greater than 70 you will inject 6 units of insulin for meal coverage. At bedtime, you will check your blood sugar and have a high protein bedtime snack.  That snacking consists of peanut butter, Greek yogurt, or tuna, or any high protein snack.  At that point you will give yourself 30 units of Lantus. We will continue metformin 1000 mg twice daily with meals. We also discussed the carbohydrate modified  diet throughout the day.  That should be divided over 4-5 meals.  Also, you can have high protein snacks throughout.  Limit your fruits to 1-2 servings per day due to the amount of natural sugar.  Limit the amount of carbohydrates examples are tacos, burritos, bread, rice, and/or beans.  Also, limit the amount of sugary drinks you consume like soda, juice, beer, wine, and/or liquor. We will follow-up in the office in 6 weeks to assess whether we have had improvement on this medication and diet regimen.  I recommend that you check your feet daily at home to make sure that you do not have any breakdown.  Also at some point this year we will need to get an eye exam.   Su azcar en la sangre no se controla con el rgimen de medicacin actual.  Es debido al  incumplimiento y no conseguir sus International aid/development workerrecargas apropiadamente.  Todos los medicamentos enviados tienen Peter Kiewit Sonsrecargas en las botellas, as que asegrese de que cuando su suministro se est poniendo bajo, notifique a su farmacia por otro pedido de ese medicamento. El siguiente es su rgimen de International aid/development workerazcar en sangre: En las maanas al despertar, usted comprobar su azcar en la sangre.  Si el nivel de Production assistant, radioazcar en sangre es menor que 70 retener insulina.  Si el nivel de Production assistant, radioazcar en sangre es superior a 70, inyectar 6 unidades de insulina en el abdomen.  3 horas ms tarde comprobar su azcar en la sangre y si el azcar en la sangre es mayor que 70 inyectar 6 unidades de insulina para la cobertura de la comida.  3-4 horas ms tarde antes de su cena usted comprobar su azcar en la sangre y de nuevo si es mayor que 70 inyectar 6 unidades de insulina para la cobertura de la comida. A la hora de Cape Colonyacostarse, comprobar su azcar en la sangre y tendr un refrigerio de alta protena para acostarse.   Tambin discutimos la dieta modificada de carbohidratos durante todo Medical laboratory scientific officerel da.  Eso debe dividirse en 4-5 comidas.  Adems, puede tener bocadillos altos en protenas.  Limite sus frutos a 1-2 porciones por da debido a la cantidad de azcar natural.  Limitar la cantidad de carbohidratos ejemplos son tacos, burritos, pan,  arroz, y/o frijoles.  Adems, limite la cantidad de bebidas azucaradas que consume como refrescos, jugos, cerveza, vino y/o licor. Haremos un seguimiento en la oficina en 6 semanas para evaluar si hemos tenido mejora en este rgimen de medicacin y dieta.  Le recomiendo que revise sus pies diariamente en casa para asegurarse de que no tiene ningn desglose.  Tambin en algn momento de este ao tendremos que hacer un examen ocular.

## 2017-11-01 LAB — COMPREHENSIVE METABOLIC PANEL
ALK PHOS: 75 IU/L (ref 39–117)
ALT: 31 IU/L (ref 0–44)
AST: 19 IU/L (ref 0–40)
Albumin/Globulin Ratio: 2.4 — ABNORMAL HIGH (ref 1.2–2.2)
Albumin: 4.6 g/dL (ref 3.5–5.5)
BUN/Creatinine Ratio: 13 (ref 9–20)
BUN: 14 mg/dL (ref 6–24)
Bilirubin Total: 1.3 mg/dL — ABNORMAL HIGH (ref 0.0–1.2)
CALCIUM: 10 mg/dL (ref 8.7–10.2)
CO2: 20 mmol/L (ref 20–29)
CREATININE: 1.04 mg/dL (ref 0.76–1.27)
Chloride: 101 mmol/L (ref 96–106)
GFR calc Af Amer: 91 mL/min/{1.73_m2} (ref >59)
GFR, EST NON AFRICAN AMERICAN: 79 mL/min/{1.73_m2} (ref >59)
GLOBULIN, TOTAL: 1.9 g/dL (ref 1.5–4.5)
Glucose: 239 mg/dL — ABNORMAL HIGH (ref 65–99)
Potassium: 4.1 mmol/L (ref 3.5–5.2)
SODIUM: 139 mmol/L (ref 134–144)
Total Protein: 6.5 g/dL (ref 6.0–8.5)

## 2017-11-01 LAB — LIPID PANEL
CHOL/HDL RATIO: 2.3 ratio (ref 0.0–5.0)
CHOLESTEROL TOTAL: 208 mg/dL — AB (ref 100–199)
HDL: 90 mg/dL (ref >39)
LDL CALC: 101 mg/dL — AB (ref 0–99)
TRIGLYCERIDES: 87 mg/dL (ref 0–149)
VLDL CHOLESTEROL CAL: 17 mg/dL (ref 5–40)

## 2017-11-02 ENCOUNTER — Other Ambulatory Visit: Payer: Self-pay | Admitting: Family Medicine

## 2017-11-02 ENCOUNTER — Telehealth: Payer: Self-pay

## 2017-11-02 DIAGNOSIS — E785 Hyperlipidemia, unspecified: Secondary | ICD-10-CM

## 2017-11-02 MED ORDER — ATORVASTATIN CALCIUM 20 MG PO TABS: 20.0000 mg | ORAL_TABLET | Freq: Every day | ORAL | 1 refills | Status: DC

## 2017-11-02 NOTE — Telephone Encounter (Signed)
Called, no answer. Left a message for patient to call back and left call back number. Thanks!  

## 2017-11-02 NOTE — Progress Notes (Signed)
Meds ordered this encounter  Medications  . atorvastatin (LIPITOR) 20 MG tablet    Sig: Take 1 tablet (20 mg total) by mouth daily.    Dispense:  90 tablet    Refill:  1   Latifah Padin Moore Navdeep Fessenden  MSN, FNP-C [facility] [address]  [place], [place] [address] [phone]  

## 2017-11-02 NOTE — Telephone Encounter (Signed)
-----   Message from Massie MaroonLachina M Hollis, [place] sent at 11/02/2017  1:27 PM EDT ----- Regarding: lab results Please inform patient that cholesterol is elevated,primarily LDL. Goal is < 70. Will add atorvastatin 20 mg with dinner and daily aspirin 81 mg.  Will follow up in office as scheduled  Nolon NationsLachina Moore Hollis  MSN, FNP-C Patient Care [facility] [address]  [place], [place] [id number] [phone]

## 2017-12-05 MED FILL — NOVOLOG FLEXPEN SYRINGE: 100 | 33 days supply | Qty: 6 | Fill #1

## 2017-12-05 MED FILL — metFORMIN HCL 1000 MG TABS: 1000 | 30 days supply | Qty: 60 | Fill #1

## 2017-12-07 ENCOUNTER — Other Ambulatory Visit: Payer: Self-pay

## 2017-12-07 MED ORDER — PEN NEEDLES 30G X 8 MM MISC: 1.0000 | Freq: Four times a day (QID) | 3 refills | Status: DC

## 2017-12-12 ENCOUNTER — Ambulatory Visit: Payer: Self-pay | Admitting: Family Medicine

## 2017-12-22 ENCOUNTER — Emergency Department (HOSPITAL_COMMUNITY): Payer: Self-pay

## 2017-12-22 ENCOUNTER — Emergency Department (HOSPITAL_COMMUNITY)
Admission: EM | Admit: 2017-12-22 | Discharge: 2017-12-23 | Disposition: A | Discharge status: Home / Self Care | Payer: Self-pay | Attending: Emergency Medicine | Admitting: Emergency Medicine

## 2017-12-22 ENCOUNTER — Other Ambulatory Visit: Payer: Self-pay

## 2017-12-22 ENCOUNTER — Encounter (HOSPITAL_COMMUNITY): Payer: Self-pay | Admitting: *Deleted

## 2017-12-22 DIAGNOSIS — R911 Solitary pulmonary nodule: Secondary | ICD-10-CM

## 2017-12-22 DIAGNOSIS — I6789 Other cerebrovascular disease: Secondary | ICD-10-CM

## 2017-12-22 DIAGNOSIS — Z794 Long term (current) use of insulin: Secondary | ICD-10-CM | POA: Insufficient documentation

## 2017-12-22 DIAGNOSIS — Z87891 Personal history of nicotine dependence: Secondary | ICD-10-CM | POA: Insufficient documentation

## 2017-12-22 DIAGNOSIS — E119 Type 2 diabetes mellitus without complications: Secondary | ICD-10-CM | POA: Insufficient documentation

## 2017-12-22 DIAGNOSIS — M542 Cervicalgia: Secondary | ICD-10-CM | POA: Insufficient documentation

## 2017-12-22 DIAGNOSIS — Z79899 Other long term (current) drug therapy: Secondary | ICD-10-CM | POA: Insufficient documentation

## 2017-12-22 DIAGNOSIS — R51 Headache: Secondary | ICD-10-CM | POA: Insufficient documentation

## 2017-12-22 DIAGNOSIS — I10 Essential (primary) hypertension: Secondary | ICD-10-CM

## 2017-12-22 DIAGNOSIS — I679 Cerebrovascular disease, unspecified: Secondary | ICD-10-CM

## 2017-12-22 LAB — CBC WITH DIFFERENTIAL/PLATELET
Abs Immature Granulocytes: 0 10*3/uL (ref 0.0–0.1)
Basophils Absolute: 0 10*3/uL (ref 0.0–0.1)
Basophils Relative: 1 %
Eosinophils Absolute: 0.1 10*3/uL (ref 0.0–0.7)
Eosinophils Relative: 2 %
HCT: 39.2 % (ref 39.0–52.0)
Hemoglobin: 14 g/dL (ref 13.0–17.0)
Immature Granulocytes: 0 %
Lymphocytes Relative: 27 %
Lymphs Abs: 1.5 10*3/uL (ref 0.7–4.0)
MCH: 31.1 pg (ref 26.0–34.0)
MCHC: 35.7 g/dL (ref 30.0–36.0)
MCV: 87.1 fL (ref 78.0–100.0)
Monocytes Absolute: 0.4 10*3/uL (ref 0.1–1.0)
Monocytes Relative: 8 %
Neutro Abs: 3.5 10*3/uL (ref 1.7–7.7)
Neutrophils Relative %: 62 %
Platelets: 176 10*3/uL (ref 150–400)
RBC: 4.5 MIL/uL (ref 4.22–5.81)
RDW: 11.9 % (ref 11.5–15.5)
WBC: 5.5 10*3/uL (ref 4.0–10.5)

## 2017-12-22 LAB — I-STAT CHEM 8, ED
BUN: 22 mg/dL — ABNORMAL HIGH (ref 6–20)
Calcium, Ion: 1.15 mmol/L (ref 1.15–1.40)
Chloride: 104 mmol/L (ref 101–111)
Creatinine, Ser: 1 mg/dL (ref 0.61–1.24)
Glucose, Bld: 65 mg/dL (ref 65–99)
HCT: 37 % — ABNORMAL LOW (ref 39.0–52.0)
Hemoglobin: 12.6 g/dL — ABNORMAL LOW (ref 13.0–17.0)
Potassium: 3.7 mmol/L (ref 3.5–5.1)
Sodium: 140 mmol/L (ref 135–145)
TCO2: 24 mmol/L (ref 22–32)

## 2017-12-22 LAB — CBG MONITORING, ED: Glucose-Capillary: 68 mg/dL (ref 65–99)

## 2017-12-22 MED ORDER — IOPAMIDOL (ISOVUE-370) INJECTION 76%
50.0000 mL | Freq: Once | INTRAVENOUS | Status: AC | PRN
  Administered 2017-12-22: 50 mL via INTRAVENOUS

## 2017-12-22 MED ORDER — IOPAMIDOL (ISOVUE-370) INJECTION 76%
INTRAVENOUS | Status: AC
  Filled 2017-12-22: qty 50

## 2017-12-22 MED ORDER — TRAMADOL HCL 50 MG PO TABS: 50.0000 mg | ORAL_TABLET | Freq: Four times a day (QID) | ORAL | 0 refills | Status: DC | PRN

## 2017-12-22 MED ORDER — KETOROLAC TROMETHAMINE 30 MG/ML IJ SOLN: 30.0000 mg | Freq: Once | INTRAMUSCULAR | Status: DC

## 2017-12-22 NOTE — ED Provider Notes (Signed)
Patient placed in Quick Look pathway, seen and evaluated   Chief Complaint: neck pain  HPI:   Pt reports he thinks his pillow may have caused him to have neck pain  ROS:  No fever, no chills,   Physical Exam:   Gen: No distress  Neuro: Awake and Alert  Skin: Warm    Focused Exam: tender c spine, pain with range of motion,    Initiation of care has begun. The patient has been counseled on the process, plan, and necessity for staying for the completion/evaluation, and the remainder of the medical screening examination   Osie Cheeks 12/22/17 1952    Gerhard Munch, MD 12/24/17 [phone]

## 2017-12-22 NOTE — Discharge Instructions (Addendum)
Please read attached information. If you experience any new or worsening signs or symptoms please return to the emergency room for evaluation. Please follow-up with your primary care provider or specialist as discussed. Please use medication prescribed only as directed and discontinue taking if you have any concerning signs or symptoms.    Please return to the emergency department if you experience any weakness or numbness in your extremities, numbness in your groin, loss of bowel or bladder control, or difficulty walking.  You have been prescribed Tramadol for pain. This is an opioid pain medication. You may take this medication every 4-6 hours as needed for pain. Only take this medication if you need it for breakthrough pain. You may combine this medicine with naproxen, a non-steroidal anti-inflammatory drug (NSAID) every 12 hours, so you are getting something for pain relief every 3 hours.  Do not combine this medication with Tylenol, as it may increase the risk of liver problems.  Do not combine this medication with alcohol.  Please be advised to avoid driving or operating heavy machinery while taking this medication, as it may make you drowsy so do not drive, drink, or operate machinery.  CT scan of your neck showed no problems with the vessels of your neck.  This is reassuring.  It did show that you have a small lung nodule at the top of your lungs, and this will need to be followed by your primary doctor Ms. Hollis, as you may need a chest CT scan.  Your CT scan also showed that you have some chronic changes that are associated likely with high blood pressure, but are not specific for anything abnormal.  Please follow-up with Ms. Hollis regarding this.     Por favor lea la informacin adjunta. Si experimenta signos o sntomas nuevos o que empeoran, por favor regrese a la sala de emergencias para su evaluacin. Por favor, haga un seguimiento con su proveedor de atencin primaria o  especialista segn lo discutido. Use los medicamentos recetados solo segn las indicaciones y deje de tomarlos si tiene signos o sntomas relacionados.  Regrese al departamento de emergencias si experimenta alguna debilidad o entumecimiento en las extremidades, entumecimiento en la ingle, prdida del control de los intestinos o la vejiga, o dificultad para caminar.  Se le ha prescrito tramadol para el dolor. Este es un medicamento opioide para Chief Technology Officer. Puede tomar [employer] cada 4-6 horas segn sea necesario para Chief Technology Officer. Solo tome este medicamento si lo necesita para el dolor irruptivo. Puede combinar este medicamento con naproxeno, un medicamento antiinflamatorio no esteroideo (AINE) cada 12 horas, por lo que est obteniendo algo para Engineer, materials cada 3 horas.  No combine este medicamento con Tylenol, ya que puede aumentar el riesgo de problemas hepticos.  No combine este medicamento con alcohol.  Tenga en cuenta que debe evitar conducir o Chemical engineer maquinaria pesada mientras toma este medicamento, ya que puede causarle somnolencia, por lo que no conduzca, beba ni opere maquinaria.  La tomografa computarizada de su cuello no mostr problemas con los vasos de su cuello. Esto es tranquilizador. Demostr que tiene un pequeo ndulo pulmonar en la parte superior de sus pulmones y que su mdico de cabecera, la Sra. Hollis, deber seguirle, ya que es posible que necesite una tomografa computarizada del trax.  Su tomografa computarizada tambin mostr que tiene algunos cambios crnicos que se asocian probablemente con presin arterial alta, pero que no son especficos para nada anormal. Por favor haga un seguimiento con la  Leanord Hawking al respecto.

## 2017-12-22 NOTE — ED Notes (Signed)
Patient transported to CT scan . 

## 2017-12-22 NOTE — ED Provider Notes (Addendum)
[facility] EMERGENCY DEPARTMENT Provider Note   CSN: [id number] Arrival date & time: 12/22/17  1939     History   Chief Complaint Chief Complaint  Patient presents with  . Neck Pain    HPI Kevin Hubbard is a 57 y.o. male.  HPI   57 year old male presents today with complaints of neck pain.  Patient reports 1 week history of posterior neck pain located at the upper cervical region.  Patient notes that symptoms started after waking, he denies any associated neurological deficits.  He notes yesterday he did have one episode of dizziness, non-severe that did not persist.  He denies any associated neurological deficits.  Patient reports that symptoms are not made worse with range of motion of the neck, palpation of the neck.  He notes taking ibuprofen at home without improvement in his symptoms.  Patient denies any trauma to his neck, denies any history of the same.   Past Medical History:  Diagnosis Date  . Diabetes mellitus without complication [facility])     Patient Active Problem List   Diagnosis Date Noted  . Language barrier to communication 10/31/2017  . Elevated serum glucose with glucosuria 09/29/2016  . Diabetes mellitus ([place]) 09/01/2015    Past Surgical History:  Procedure Laterality Date  . ABDOMINAL SURGERY          Home Medications    Prior to Admission medications   Medication Sig Start Date End Date Taking? Authorizing Provider  atorvastatin (LIPITOR) 20 MG tablet Take 1 tablet (20 mg total) by mouth daily. 11/02/17   Dorena Dew, FNP  Blood Glucose Monitoring Suppl (TRUE METRIX AIR GLUCOSE METER) w/Device KIT 1 each by Does not apply route 4 (four) times daily -  with meals and at bedtime. 09/02/15   Dorena Dew, FNP  glucose blood (TRUE METRIX BLOOD GLUCOSE TEST) test strip Use as instructed 10/31/17   Dorena Dew, FNP  insulin aspart (NOVOLOG FLEXPEN) 100 UNIT/ML FlexPen Inject 6 Units into the skin 3 (three) times daily with  meals. 10/31/17   Dorena Dew, FNP  insulin glargine (LANTUS) 100 UNIT/ML injection Inject 0.3 mLs (30 Units total) into the skin at bedtime. 10/31/17   Dorena Dew, FNP  Insulin Pen Needle (PEN NEEDLES) 30G X 8 MM MISC 1 each by Does not apply route 4 (four) times daily. 12/07/17   Dorena Dew, FNP  Insulin Syringe-Needle U-100 Flossie Buffy INSULIN SYRINGE) 29G X 1/2" 0.3 ML MISC USE AS DIRECTED 4 TIMES DAILY WITH MEALS AND AT BEDTIME 10/31/17   Dorena Dew, FNP  lisinopril (ZESTRIL) 2.5 MG tablet Take 1 tablet (2.5 mg total) by mouth daily. 10/31/17   Dorena Dew, FNP  metFORMIN (GLUCOPHAGE) 1000 MG tablet TAKE 1 TABLET BY MOUTH 2 TIMES DAILY WITH A MEAL. 10/31/17   Dorena Dew, FNP  traMADol (ULTRAM) 50 MG tablet Take 1 tablet (50 mg total) by mouth every 6 (six) hours as needed. 12/22/17   Okey Regal, PA-C    Family History No family history on file.  Social History Social History   Tobacco Use  . Smoking status: Former Smoker    Last attempt to quit: 07/17/2012    Years since quitting: 5.4  . Smokeless tobacco: Never Used  Substance Use Topics  . Alcohol use: No  . Drug use: No     Allergies   Patient has no known allergies.   Review of Systems Review of Systems  All other  systems reviewed and are negative.  Physical Exam Updated Vital Signs BP 138/82   Pulse 68   Temp 98.6 F (37 C)   Resp 16   SpO2 100%   Physical Exam  Constitutional: He is oriented to person, place, and time. He appears well-developed and well-nourished.  HENT:  Head: Normocephalic and atraumatic.  Oropharynx clear, no swelling or edema  Eyes: Pupils are equal, round, and reactive to light. Conjunctivae and EOM are normal. Right eye exhibits no discharge. Left eye exhibits no discharge. No scleral icterus.  Neck: Normal range of motion. No JVD present. No tracheal deviation present.  Pulmonary/Chest: Effort normal. No stridor.  Musculoskeletal:  No CT or L-spine  tenderness palpation, full active range of motion the neck without worsening of symptoms, anterior neck nontender, no no masses  Neurological: He is alert and oriented to person, place, and time. No cranial nerve deficit or sensory deficit. He exhibits normal muscle tone. Coordination normal.  Psychiatric: He has a normal mood and affect. His behavior is normal. Judgment and thought content normal.  Nursing note and vitals reviewed.    ED Treatments / Results  Labs (all labs ordered are listed, but only abnormal results are displayed) Labs Reviewed  I-STAT CHEM 8, ED - Abnormal; Notable for the following components:      Result Value   BUN 22 (*)    Hemoglobin 12.6 (*)    HCT 37.0 (*)    All other components within normal limits  CBC WITH DIFFERENTIAL/PLATELET  CBG MONITORING, ED    EKG None  Radiology Dg Cervical Spine Complete  Result Date: 12/22/2017 CLINICAL DATA:  RIGHT neck pain for 1 week.  Initial encounter. EXAM: CERVICAL SPINE - COMPLETE 4+ VIEW COMPARISON:  None. FINDINGS: There is no evidence of acute fracture, subluxation or prevertebral soft tissue swelling. Mild degenerative disc disease, spondylosis and facet arthropathy from C4-C7 identified. There is bony foraminal narrowing on the RIGHT at C4-5 and on the LEFT at C5-6. No focal bony lesions are present. IMPRESSION: 1. No evidence of acute abnormality 2. Degenerative changes as described with RIGHT bony foraminal narrowing at C4-5 and LEFT bony foraminal narrowing at C5-6. Electronically Signed   By: Margarette Canada M.D.   On: 12/22/2017 20:40    Procedures Procedures (including critical care time)  Medications Ordered in ED Medications  iopamidol (ISOVUE-370) 76 % injection 50 mL (50 mLs Intravenous Contrast Given 12/22/17 2128)     Initial Impression / Assessment and Plan / ED Course  I have reviewed the triage vital signs and the nursing notes.  Pertinent labs & imaging results that were available during my  care of the patient were reviewed by me and considered in my medical decision making (see chart for details).     Labs: I stat chem 8, cbc  Imaging: Dg cervical Spine  Consults:  Therapeutics:  Discharge Meds:   Assessment/Plan: 20 YOF presents today with complaints of neck pain. This pain is nonreproducible, patient is describes this is deep.  He has no acute neurological deficits presently.  Given the fact that this is nonreproducible and patient's uncomfortableness concern for deep space involving including vascular causes is on the differential.  Patient will have CT angios head and neck here to rule out dissection, patient care will be assigned to oncoming provider pending results and disposition.     Final Clinical Impressions(s) / ED Diagnoses   Final diagnoses:  Neck pain    ED Discharge Orders  Ordered    traMADol (ULTRAM) 50 MG tablet  Every 6 hours PRN     12/22/17 2143         Okey Regal, PA-C 12/22/17 2148    Drenda Freeze, MD 12/22/17 2207

## 2017-12-22 NOTE — ED Triage Notes (Addendum)
Pt c/o R sided neck pain for the past week. Has taken Tylenol without relief. Denies direct injury

## 2017-12-23 MED ORDER — KETOROLAC TROMETHAMINE 30 MG/ML IJ SOLN: 30.0000 mg | Freq: Once | INTRAMUSCULAR | Status: DC

## 2017-12-23 MED ORDER — KETOROLAC TROMETHAMINE 15 MG/ML IJ SOLN
15.0000 mg | Freq: Once | INTRAMUSCULAR | Status: AC
  Administered 2017-12-23: 15 mg via INTRAVENOUS

## 2017-12-23 MED ORDER — KETOROLAC TROMETHAMINE 15 MG/ML IJ SOLN
15.0000 mg | Freq: Once | INTRAMUSCULAR | Status: DC
  Filled 2017-12-23: qty 1

## 2017-12-23 NOTE — ED Provider Notes (Signed)
Physical Exam  BP (!) 137/92 (BP Location: Right Arm)   Pulse 70   Temp 98.6 F (37 C)   Resp 18   SpO2 99%   Assumed care from [facility], PA-C at 2200. Briefly, the patient is a 57 y.o. male with PMHx of  has a past medical history of Diabetes mellitus without complication (HCC). here with neck pain.  Please see PA Hedges note for full H&P.  Patient reports he has had neck pain for 4 days, describes it is high up in the neck, midline as well as in a bandlike pattern across the base of the skull.  Patient reports he woke up with the pain 4 days ago.  Patient reports he may have felt dizzy while at work 2 days ago, but otherwise denies vertigo, visual disturbance, weakness or numbness in upper or lower extremities, or difficulty walking.  Patient denies any fever or chills.  Patient also notes that today he developed a global headache particularly in a bandlike pattern across his temporal region.  Labs Reviewed  I-STAT CHEM 8, ED - Abnormal; Notable for the following components:      Result Value   BUN 22 (*)    Hemoglobin 12.6 (*)    HCT 37.0 (*)    All other components within normal limits  CBC WITH DIFFERENTIAL/PLATELET  CBG MONITORING, ED    Course of Care:   Physical Exam  Constitutional: He appears well-developed and well-nourished. No distress.  Sitting comfortably in bed.  HENT:  Head: Normocephalic and atraumatic.  Eyes: Conjunctivae are normal. Right eye exhibits no discharge. Left eye exhibits no discharge.  EOMs normal to gross examination.  Neck: Normal range of motion.  Cardiovascular: Normal rate and regular rhythm.  Intact, 2+ radial pulse bilaterally.  Pulmonary/Chest:  Normal respiratory effort. Patient converses comfortably. No audible wheeze or stridor.  Abdominal: He exhibits no distension.  Musculoskeletal:  Patient has no midline bony tenderness of cervical spine.  Neurological: He is alert.  Cranial nerves intact to gross observation. PALPATION: No  midline tenderness. ROM of cervical spine intact with flexion/extension/lateral flexion/lateral rotation; Patient can laterally rotate cervical spine greater than 45 degrees.  MOTOR: 5/5 strength b/l with resisted shoulder abduction/adduction, biceps flexion (C5/6), biceps extension (C6-C8), wrist flexion, wrist extension (C6-C8), and grip strength (C7-T1) 2+ DTRs in the biceps, brachioradialis, and triceps SENSORY: Sensation is intact to light touch in:  Superficial radial nerve distribution (dorsal first web space) Median nerve distribution (tip of index finger)   Ulnar nerve distribution (tip of small finger)  Normal and symmetric gait.   Skin: Skin is warm and dry. He is not diaphoretic.  Psychiatric: He has a normal mood and affect. His behavior is normal. Judgment and thought content normal.  Nursing note and vitals reviewed.   ED Course/Procedures     Procedures  MDM  Plan per PA Hedges, is reassessment after CT angios of the head and neck is back due to not clear reproducibility of pain on exam.  If negative for any dissection, patient likely has torticollis and can be discharged with tramadol prescription per previous provider. Patient has no leukocytosis.   CT angio the head and neck demonstrates no dissection.  It incidentally shows a 4 mm subpleural lung nodule, that patient was informed about, and a note in the electronic medical record was sent to his primary care provider, Julianne Handler, FNP.  Patient was instructed he needs to follow-up on this as well as the hypodensity seen  on the CT scan, possibly microvascular nature.  Patient is on antihypertensives as well as statin therapy.  Discussed hypodensity seen on CT of the head with Dr. Amada Jupiter of neurology, who states that these are nonspecific, and unlikely related to patient's headache.   Patient given Toradol prior to discharge, and discharged with no acute distress, and ambulating and neurologically intact in upper  extremity's.  No neurologic deficit on exam or symptomatology to suggest acute need for MRI.  Patient given strict return precautions using [employer] Spanish interpreter, [place], [phone].     Delia Chimes 12/23/17 0410    Palumbo, April, MD 12/23/17 0454

## 2017-12-23 NOTE — ED Notes (Signed)
ED Provider at bedside. 

## 2017-12-29 ENCOUNTER — Encounter: Payer: Self-pay | Admitting: Family Medicine

## 2017-12-29 ENCOUNTER — Ambulatory Visit (INDEPENDENT_AMBULATORY_CARE_PROVIDER_SITE_OTHER): Payer: Self-pay | Admitting: Family Medicine

## 2017-12-29 VITALS — BP 140/71 | HR 54 | Temp 97.5°F | Resp 14 | Ht 62.0 in | Wt 120.0 lb

## 2017-12-29 DIAGNOSIS — Z789 Other specified health status: Secondary | ICD-10-CM

## 2017-12-29 DIAGNOSIS — M542 Cervicalgia: Secondary | ICD-10-CM

## 2017-12-29 DIAGNOSIS — R930 Abnormal findings on diagnostic imaging of skull and head, not elsewhere classified: Secondary | ICD-10-CM

## 2017-12-29 DIAGNOSIS — E119 Type 2 diabetes mellitus without complications: Secondary | ICD-10-CM

## 2017-12-29 LAB — POCT URINALYSIS DIPSTICK
Bilirubin, UA: NEGATIVE
Glucose, UA: POSITIVE — AB
KETONES UA: NEGATIVE
Leukocytes, UA: NEGATIVE
NITRITE UA: NEGATIVE
PROTEIN UA: POSITIVE — AB
RBC UA: NEGATIVE
SPEC GRAV UA: 1.025 (ref 1.010–1.025)
UROBILINOGEN UA: 2 U/dL — AB
pH, UA: 6.5 (ref 5.0–8.0)

## 2017-12-29 LAB — POCT GLYCOSYLATED HEMOGLOBIN (HGB A1C): Hemoglobin A1C: 8.9 % — AB (ref 4.0–5.6)

## 2017-12-29 LAB — GLUCOSE, POCT (MANUAL RESULT ENTRY): POC GLUCOSE: 183 mg/dL — AB (ref 70–99)

## 2017-12-29 MED ORDER — ACETAMINOPHEN 500 MG PO TABS: 500.0000 mg | ORAL_TABLET | Freq: Four times a day (QID) | ORAL | 1 refills | Status: DC | PRN

## 2017-12-29 NOTE — Progress Notes (Signed)
Subjective:    Patient ID: Kevin Hubbard, male    DOB: [birth year], 59 y.o.   MRN: [id number] Kevin Hubbard, a 57 year old male with a history of uncontrolled type 2 DM presents for follow up of type 2 diabetes mellitus and to discuss abnormal findings on neck/head CTA. Mr. Kestler primarily speaks spanish, using video interpreter to assist with communication.  Patient was evaluated in the emergency department on 12/22/2017 for neck pain. Patient reports that neck pain occurred suddenly and he reported to the emergency department. Patient underwent a CT of head and neck, which showed underlying microdensities representing microvascular ischemic changes. In addition, a 4 mm subpleural left upper lobe nodule was noted.  Patient says that neck pain has resolved since ER visit. He is no longer taking pain medications.   Patient is also following up on type 2 diabetes mellitus. He says that he has been taking all medications consistently since reestablishing care several weeks ago. He did not bring glucometer for review.   Diabetes  Pertinent negatives for hypoglycemia include no dizziness, headaches, nervousness/anxiousness, speech difficulty, sweats or tremors. Associated symptoms include blurred vision. Pertinent negatives for diabetes include no chest pain, no fatigue, no polydipsia, no polyphagia, no polyuria and no weakness. Pertinent negatives for diabetic complications include no CVA or heart disease. Risk factors for coronary artery disease include diabetes mellitus and male sex. When asked about current treatments, none were reported. He is compliant with treatment some of the time. He is following a generally unhealthy diet. He has not had a previous visit with a dietitian. He never participates in exercise. An ACE inhibitor/angiotensin II receptor blocker is being taken. He does not see a podiatrist.Eye exam is not current.      Past Medical History:  Diagnosis Date  . Diabetes mellitus without  complication [facility])    Past Surgical History:  Procedure Laterality Date  . ABDOMINAL SURGERY     Social History   Socioeconomic History  . Marital status: Single    Spouse name: Not on file  . Number of children: Not on file  . Years of education: Not on file  . Highest education level: Not on file  Occupational History  . Not on file  Social Needs  . Financial resource strain: Not on file  . Food insecurity:    Worry: Not on file    Inability: Not on file  . Transportation needs:    Medical: Not on file    Non-medical: Not on file  Tobacco Use  . Smoking status: Former Smoker    Last attempt to quit: 07/17/2012    Years since quitting: 5.4  . Smokeless tobacco: Never Used  Substance and Sexual Activity  . Alcohol use: No  . Drug use: No  . Sexual activity: Not on file  Lifestyle  . Physical activity:    Days per week: Not on file    Minutes per session: Not on file  . Stress: Not on file  Relationships  . Social connections:    Talks on phone: Not on file    Gets together: Not on file    Attends religious service: Not on file    Active member of club or organization: Not on file    Attends meetings of clubs or organizations: Not on file    Relationship status: Not on file  . Intimate partner violence:    Fear of current or ex partner: Not on file    Emotionally abused: Not  on file    Physically abused: Not on file    Forced sexual activity: Not on file  Other Topics Concern  . Not on file  Social History Narrative  . Not on file  No Known Allergies  Immunization History  Administered Date(s) Administered  . Influenza,inj,Quad PF,6+ Mos 09/01/2015, 09/29/2016  . Pneumococcal Polysaccharide-23 09/29/2016  . Tdap 02/18/2015, 09/01/2015   Review of Systems  Constitutional: Negative for fatigue and fever.  HENT: Negative.   Eyes: Positive for blurred vision.  Respiratory: Negative.   Cardiovascular: Negative.  Negative for chest pain.  Gastrointestinal:  Negative.   Endocrine: Negative for polydipsia, polyphagia and polyuria.  Genitourinary: Negative.   Musculoskeletal: Negative.   Skin: Negative.   Allergic/Immunologic: Negative.   Neurological: Negative.  Negative for dizziness, tremors, speech difficulty, weakness and headaches.  Hematological: Negative.   Psychiatric/Behavioral: Negative.  The patient is not nervous/anxious.       Objective:   Physical Exam  Constitutional: He is oriented to person, place, and time.  HENT:  Head: Normocephalic and atraumatic.  Right Ear: External ear normal. No tenderness. Tympanic membrane is scarred.  Left Ear: External ear normal. No tenderness. Tympanic membrane is scarred.  Mouth/Throat: Oropharynx is clear and moist and mucous membranes are normal. Abnormal dentition. Dental caries present.  TM cloudy, landmarks difficult to visualize  Eyes: Pupils are equal, round, and reactive to light. Conjunctivae and EOM are normal.  Neck: Normal range of motion. Neck supple.  Cardiovascular: Normal rate, normal heart sounds and intact distal pulses.  Pulmonary/Chest: Effort normal and breath sounds normal.  Abdominal: Soft. Bowel sounds are normal.  Musculoskeletal: Normal range of motion.  Neurological: He is alert and oriented to person, place, and time. He has normal reflexes.  Skin: Skin is warm and dry.  Psychiatric: He has a normal mood and affect. His behavior is normal. Judgment and thought content normal.      BP 140/71 (BP Location: Right Arm, Patient Position: Sitting, Cuff Size: Normal)   Pulse (!) 54   Temp (!) 97.5 F (36.4 C) (Oral)   Resp 14   Ht  (1.575 m)   Wt 120 lb (54.4 kg)   SpO2 100%   BMI 21.95 kg/m  Assessment & Plan:  Type 2 diabetes mellitus without complication, without long-term current use of insulin (HCC) Hemoglobin a1C improved from 13.8 to 8.9, no medication changes warranted on today.    Patient will continue Novolog 6 units for meal coverage.  Discussed the importance of check blood sugar levels prior to meals. Will also continue Lantus 30 units HS.   Patient educated on basic carbohydrate counting. He will need more education concerning diet and education regimen. Patient given information on type 2 diabetes support group.   The patient is asked to make an attempt to improve diet and exercise patterns to aid in medical management of this problem. - HgB A1c - Glucose (CBG) - Urinalysis Dipstick  2. Neck pain  - acetaminophen (TYLENOL) 500 MG tablet; Take 1 tablet (500 mg total) by mouth every 6 (six) hours as needed.  Dispense: 30 tablet; Refill: 1  3. Abnormal CT scan, head Discussed findings of head CT at length.  1. No acute intracranial abnormality. 2. Mild scattered hypodensities involving the supratentorial cerebral white matter, nonspecific, but may be related to chronic microvascular ischemic disease. Differential considerations include sequelae of migrainous disorder or possible previous infectious or inflammatory process. Although not typical for demyelinating disease, this is not entirely  excluded. No imaging findings to suggest vasculitis on corresponding CTA. Further assessment with dedicated brain MRI could be performed for further evaluation as clinically warranted.  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  Patient does not warrant further work up and evaluation at this time  4. Language barrier to communication Spanish interpreter utilized to assist with communication  RTC: 3 months for chronic conditions     Arvind Mexicano Rennis Petty  MSN, FNP-C [facility] [address] [place], [place] [address] [phone]

## 2018-01-05 MED FILL — NOVOLOG FLEXPEN SYRINGE: 100 | 33 days supply | Qty: 6 | Fill #2

## 2018-01-10 MED FILL — metFORMIN HCL 1000 MG TABS: 1000 | 30 days supply | Qty: 60 | Fill #2

## 2018-02-10 MED FILL — TRUE METRIX TEST STRIP: 30 days supply | Qty: 100 | Fill #1

## 2018-02-10 MED FILL — TRUEPLUS SYR 0.3ML 30GX5/16: 30G X 5/16" | 25 days supply | Qty: 100 | Fill #0

## 2018-02-10 MED FILL — NOVOLOG FLEXPEN SYRINGE: 100 | 33 days supply | Qty: 6 | Fill #3

## 2018-02-14 ENCOUNTER — Encounter (HOSPITAL_COMMUNITY): Payer: Self-pay

## 2018-02-14 ENCOUNTER — Other Ambulatory Visit: Payer: Self-pay

## 2018-02-14 ENCOUNTER — Emergency Department (HOSPITAL_COMMUNITY)
Admission: EM | Admit: 2018-02-14 | Discharge: 2018-02-14 | Disposition: A | Discharge status: Home / Self Care | Payer: Self-pay | Attending: Emergency Medicine | Admitting: Emergency Medicine

## 2018-02-14 DIAGNOSIS — M542 Cervicalgia: Secondary | ICD-10-CM | POA: Insufficient documentation

## 2018-02-14 DIAGNOSIS — Z794 Long term (current) use of insulin: Secondary | ICD-10-CM | POA: Insufficient documentation

## 2018-02-14 DIAGNOSIS — G8929 Other chronic pain: Secondary | ICD-10-CM | POA: Insufficient documentation

## 2018-02-14 DIAGNOSIS — Z79899 Other long term (current) drug therapy: Secondary | ICD-10-CM | POA: Insufficient documentation

## 2018-02-14 DIAGNOSIS — E119 Type 2 diabetes mellitus without complications: Secondary | ICD-10-CM | POA: Insufficient documentation

## 2018-02-14 DIAGNOSIS — E860 Dehydration: Secondary | ICD-10-CM | POA: Insufficient documentation

## 2018-02-14 DIAGNOSIS — R7989 Other specified abnormal findings of blood chemistry: Secondary | ICD-10-CM

## 2018-02-14 LAB — BASIC METABOLIC PANEL
ANION GAP: 12 (ref 5–15)
BUN: 25 mg/dL — ABNORMAL HIGH (ref 6–20)
CO2: 24 mmol/L (ref 22–32)
CREATININE: 2.1 mg/dL — AB (ref 0.61–1.24)
Calcium: 10.2 mg/dL (ref 8.9–10.3)
Chloride: 104 mmol/L (ref 98–111)
GFR, EST AFRICAN AMERICAN: 38 mL/min — AB (ref >60)
GFR, EST NON AFRICAN AMERICAN: 33 mL/min — AB (ref >60)
Glucose, Bld: 148 mg/dL — ABNORMAL HIGH (ref 70–99)
Potassium: 4.5 mmol/L (ref 3.5–5.1)
SODIUM: 140 mmol/L (ref 135–145)

## 2018-02-14 LAB — CBC
HCT: 47.5 % (ref 39.0–52.0)
HEMOGLOBIN: 16.9 g/dL (ref 13.0–17.0)
MCH: 31.2 pg (ref 26.0–34.0)
MCHC: 35.6 g/dL (ref 30.0–36.0)
MCV: 87.8 fL (ref 78.0–100.0)
PLATELETS: 186 10*3/uL (ref 150–400)
RBC: 5.41 MIL/uL (ref 4.22–5.81)
RDW: 12.5 % (ref 11.5–15.5)
WBC: 10.1 10*3/uL (ref 4.0–10.5)

## 2018-02-14 LAB — CBG MONITORING, ED: GLUCOSE-CAPILLARY: 127 mg/dL — AB (ref 70–99)

## 2018-02-14 MED ORDER — SODIUM CHLORIDE 0.9 % IV BOLUS
1000.0000 mL | Freq: Once | INTRAVENOUS | Status: AC
  Administered 2018-02-14: 1000 mL via INTRAVENOUS

## 2018-02-14 MED ORDER — METHOCARBAMOL 500 MG PO TABS: 1000.0000 mg | ORAL_TABLET | Freq: Four times a day (QID) | ORAL | 0 refills | Status: DC

## 2018-02-14 NOTE — Discharge Instructions (Signed)
Please read and follow all provided instructions.  Your diagnoses today include:  1. Dehydration   2. Elevated serum creatinine   3. Chronic neck pain     Tests performed today include:  Blood counts and electrolytes - shows weak kidney function likely due to dehydration  EKG  Vital signs. See below for your results today.   Medications prescribed:   Robaxin (methocarbamol) - muscle relaxer medication  DO NOT drive or perform any activities that require you to be awake and alert because this medicine can make you drowsy.   Take any prescribed medications only as directed.  Home care instructions:  Follow any educational materials contained in this packet.  BE VERY CAREFUL not to take multiple medicines containing Tylenol (also called acetaminophen). Doing so can lead to an overdose which can damage your liver and cause liver failure and possibly death.   Follow-up instructions: Please follow-up with your primary care provider in the next 3 days for further evaluation of your symptoms.   Return instructions:   Please return to the Emergency Department if you experience worsening symptoms.   Please return if you have any other emergent concerns.  Additional Information:  Your vital signs today were: BP 136/76    Pulse 66    Temp 98 F (36.7 C) (Oral)    Resp 16    SpO2 100%  If your blood pressure (BP) was elevated above 135/85 this visit, please have this repeated by your doctor within one month. --------------

## 2018-02-14 NOTE — ED Notes (Signed)
Pt verbalized understanding of d/c instructions pt denied any further requests. Signature pad unavailable.

## 2018-02-14 NOTE — ED Notes (Signed)
Diet Coke provided to patient for fluid challenge.

## 2018-02-14 NOTE — ED Notes (Signed)
Interpreter at bedside.

## 2018-02-14 NOTE — ED Triage Notes (Signed)
Pt states he has been dizzy since yesterday. He reports dizziness has persisted today, using interpreter he feels like he is sleep walking. Pt is alert and oriented. Initial BP 88/75. Repeat 91/74. Pt also reports being unable to sleep due to neck pain. Pt denies chest pain or shortness of breath.

## 2018-02-14 NOTE — ED Provider Notes (Signed)
[facility] Provider Note   CSN: [id number] Arrival date & time: 02/14/18  1256     History   Chief Complaint Chief Complaint  Patient presents with  . Dizziness    HPI Kevin Hubbard is a 57 y.o. male.  Patient with h/o DM, spanish-speaking -- presents with c/o dizziness described as lightheadedness starting yesterday.  Patient states that symptoms started while he was at work.  He did not have full syncope.  He reports having episodes of nonbloody, nonbilious emesis and nonbloody diarrhea yesterday.  He states that he has been drinking sodas.  He got up this morning to go to work but continued to feel very dizzy.  He states that the dizziness can occur at any time including with standing.  He denies any chest pain, shortness of breath, or abdominal pain.  He has had some intermittent blurry vision but no loss of vision.  He has chronic neck pain which is ongoing.  Patient was seen for this in 11/2017 and had a CT angiography performed of the head and neck that time which did not show any acute findings.  He states that he was prescribed pain medication for this and since then has been buying medication for pain from friends because he ran out. The onset of this condition was acute. The course is constant. Aggravating factors: none. Alleviating factors: none.       Past Medical History:  Diagnosis Date  . Diabetes mellitus without complication [facility])     Patient Active Problem List   Diagnosis Date Noted  . Language barrier to communication 10/31/2017  . Elevated serum glucose with glucosuria 09/29/2016  . Diabetes mellitus ([place]) 09/01/2015    Past Surgical History:  Procedure Laterality Date  . ABDOMINAL SURGERY          Home Medications    Prior to Admission medications   Medication Sig Start Date End Date Taking? Authorizing Provider  acetaminophen (TYLENOL) 500 MG tablet Take 1 tablet (500 mg total) by mouth every 6 (six) hours  as needed. 12/29/17   Dorena Dew, FNP  atorvastatin (LIPITOR) 20 MG tablet Take 1 tablet (20 mg total) by mouth daily. Patient not taking: Reported on 12/29/2017 11/02/17   Dorena Dew, FNP  Blood Glucose Monitoring Suppl (TRUE METRIX AIR GLUCOSE METER) w/Device KIT 1 each by Does not apply route 4 (four) times daily -  with meals and at bedtime. 09/02/15   Dorena Dew, FNP  glucose blood (TRUE METRIX BLOOD GLUCOSE TEST) test strip Use as instructed 10/31/17   Dorena Dew, FNP  insulin aspart (NOVOLOG FLEXPEN) 100 UNIT/ML FlexPen Inject 6 Units into the skin 3 (three) times daily with meals. 10/31/17   Dorena Dew, FNP  insulin glargine (LANTUS) 100 UNIT/ML injection Inject 0.3 mLs (30 Units total) into the skin at bedtime. 10/31/17   Dorena Dew, FNP  Insulin Pen Needle (PEN NEEDLES) 30G X 8 MM MISC 1 each by Does not apply route 4 (four) times daily. 12/07/17   Dorena Dew, FNP  Insulin Syringe-Needle U-100 Flossie Buffy INSULIN SYRINGE) 29G X 1/2" 0.3 ML MISC USE AS DIRECTED 4 TIMES DAILY WITH MEALS AND AT BEDTIME 10/31/17   Dorena Dew, FNP  lisinopril (ZESTRIL) 2.5 MG tablet Take 1 tablet (2.5 mg total) by mouth daily. 10/31/17   Dorena Dew, FNP  metFORMIN (GLUCOPHAGE) 1000 MG tablet TAKE 1 TABLET BY MOUTH 2 TIMES DAILY WITH A MEAL. 10/31/17  Dorena Dew, FNP  traMADol (ULTRAM) 50 MG tablet Take 1 tablet (50 mg total) by mouth every 6 (six) hours as needed. 12/22/17   Okey Regal, PA-C    Family History History reviewed. No pertinent family history.  Social History Social History   Tobacco Use  . Smoking status: Former Smoker    Last attempt to quit: 07/17/2012    Years since quitting: 5.5  . Smokeless tobacco: Never Used  Substance Use Topics  . Alcohol use: No  . Drug use: No     Allergies   Patient has no known allergies.   Review of Systems Review of Systems  Constitutional: Negative for fever.  HENT: Negative for rhinorrhea  and sore throat.   Eyes: Positive for visual disturbance. Negative for redness.  Respiratory: Negative for cough.   Cardiovascular: Negative for chest pain.  Gastrointestinal: Positive for diarrhea, nausea and vomiting. Negative for abdominal pain.  Genitourinary: Negative for dysuria.  Musculoskeletal: Positive for neck pain. Negative for myalgias.  Skin: Negative for rash.  Neurological: Positive for dizziness and light-headedness. Negative for numbness and headaches.     Physical Exam Updated Vital Signs BP 91/74 (BP Location: Right Arm)   Pulse (!) 112   Temp 98 F (36.7 C) (Oral)   Resp 16   SpO2 100%   Physical Exam  Constitutional: He is oriented to person, place, and time. He appears well-developed and well-nourished.  HENT:  Head: Normocephalic and atraumatic.  Right Ear: Tympanic membrane, external ear and ear canal normal.  Left Ear: Tympanic membrane, external ear and ear canal normal.  Nose: Nose normal.  Mouth/Throat: Uvula is midline, oropharynx is clear and moist and mucous membranes are normal.  Slightly dry mucous membranes.  Eyes: Pupils are equal, round, and reactive to light. Conjunctivae, EOM and lids are normal. Right eye exhibits no discharge. Left eye exhibits no discharge.  Neck: Normal range of motion. Neck supple. Carotid bruit is not present.  Cardiovascular: Regular rhythm and normal heart sounds. Tachycardia present.  Pulmonary/Chest: Effort normal and breath sounds normal. No respiratory distress. He has no wheezes.  Abdominal: Soft. There is no tenderness. There is no rebound and no guarding.  Musculoskeletal: Normal range of motion.       Cervical back: He exhibits normal range of motion, no tenderness and no bony tenderness.  Neurological: He is alert and oriented to person, place, and time. He has normal strength and normal reflexes. No cranial nerve deficit or sensory deficit. He exhibits normal muscle tone. He displays a negative Romberg  sign. Coordination and gait normal. GCS eye subscore is 4. GCS verbal subscore is 5. GCS motor subscore is 6.  Patient stands up and ambulates without assistance.  No ataxia noted.  Skin: Skin is warm and dry.  Psychiatric: He has a normal mood and affect.  Nursing note and vitals reviewed.    ED Treatments / Results  Labs (all labs ordered are listed, but only abnormal results are displayed) Labs Reviewed  BASIC METABOLIC PANEL - Abnormal; Notable for the following components:      Result Value   Glucose, Bld 148 (*)    BUN 25 (*)    Creatinine, Ser 2.10 (*)    GFR calc non Af Amer 33 (*)    GFR calc Af Amer 38 (*)    All other components within normal limits  CBG MONITORING, ED - Abnormal; Notable for the following components:   Glucose-Capillary 127 (*)    All  other components within normal limits  CBC  URINALYSIS, ROUTINE W REFLEX MICROSCOPIC    EKG EKG Interpretation  Date/Time:  Tuesday February 14 2018 14:04:47 EDT Ventricular Rate:  108 PR Interval:  140 QRS Duration: 68 QT Interval:  318 QTC Calculation: 426 R Axis:   58 Text Interpretation:  Sinus tachycardia Otherwise normal ECG No previous tracing Confirmed by Blanchie Dessert [phone]) on 02/14/2018 3:18:43 PM   Radiology No results found.  Procedures Procedures (including critical care time)  Medications Ordered in ED Medications  sodium chloride 0.9 % bolus 1,000 mL (0 mLs Intravenous Stopped 02/14/18 1725)  sodium chloride 0.9 % bolus 1,000 mL (1,000 mLs Intravenous New Bag/Given 02/14/18 1725)     Initial Impression / Assessment and Plan / ED Course  I have reviewed the triage vital signs and the nursing notes.  Pertinent labs & imaging results that were available during my care of the patient were reviewed by me and considered in my medical decision making (see chart for details).     Patient seen and examined. Work-up initiated. Fluids ordered. Reassuring neuro exam. EKG reviewed.   Vital signs  reviewed and are as follows: BP 91/74 (BP Location: Right Arm)   Pulse (!) 112   Temp 98 F (36.7 C) (Oral)   Resp 16   SpO2 100%   Patient with new AKI with creatinine from 1.0 --> 2.1 since May. Elevated hgb suggests hemoconcentration. Hypotensive on arrival.   Discussed with Dr. Maryan Rued. Will reassess after fluids and determine dispo.   6:32 PM Patient's blood pressure has been improving with treatment.  His symptoms are now resolved.  He is minimally orthostatic by numbers.  Patient wants to go home and feels comfortable with going home.  He shows me a card that has an appointment scheduled with [facility] on August 30.  Will give Robaxin for neck pain to use at bedtime.  Encouraged to increase fluid intake for the next several days.  The patient was urged to return to the Emergency Department immediately with worsening of current symptoms, worsening abdominal pain, persistent vomiting, blood noted in stools, fever, or any other concerns. The patient verbalized understanding.    Final Clinical Impressions(s) / ED Diagnoses   Final diagnoses:  Dehydration  Elevated serum creatinine  Chronic neck pain   Dehydration: Likely multifactorial.  Patient has been working outside and had vomiting and diarrhea yesterday.  Also history of diabetes although well controlled today.  Clinically improved in the emergency department after fluids.  Blood pressure improved.  Orthostasis improved.  Creatinine is slightly elevated.  Will have this rechecked by PCP.  Email sent to facilitate follow-up.  Chronic neck pain: Stable.  Patient given prescription for Robaxin.  Patient had CT angio of the head and neck 2 months ago which was negative.  No neurological deficits today.  ED Discharge Orders        Ordered    methocarbamol (ROBAXIN) 500 MG tablet  4 times daily     02/14/18 1835       Carlisle Cater, Hershal Coria 02/14/18 1840    Blanchie Dessert, MD 02/16/18 2155

## 2018-02-15 ENCOUNTER — Other Ambulatory Visit: Payer: Self-pay | Admitting: Family Medicine

## 2018-02-15 ENCOUNTER — Telehealth: Payer: Self-pay | Admitting: Family Medicine

## 2018-02-15 DIAGNOSIS — R7989 Other specified abnormal findings of blood chemistry: Secondary | ICD-10-CM

## 2018-02-15 DIAGNOSIS — R799 Abnormal finding of blood chemistry, unspecified: Principal | ICD-10-CM

## 2018-02-15 DIAGNOSIS — E860 Dehydration: Secondary | ICD-10-CM

## 2018-02-15 NOTE — Progress Notes (Signed)
Labs ordered to repeat creatinine. Elevated in ED.

## 2018-02-15 NOTE — Telephone Encounter (Signed)
Please call patient and have him to come to the clinic this week for repeat blood work. His creatinine was elevated in the ED.

## 2018-02-17 ENCOUNTER — Other Ambulatory Visit: Payer: Self-pay

## 2018-02-17 DIAGNOSIS — E860 Dehydration: Secondary | ICD-10-CM

## 2018-02-17 DIAGNOSIS — R799 Abnormal finding of blood chemistry, unspecified: Principal | ICD-10-CM

## 2018-02-17 DIAGNOSIS — R7989 Other specified abnormal findings of blood chemistry: Secondary | ICD-10-CM

## 2018-02-18 LAB — COMPREHENSIVE METABOLIC PANEL
ALT: 18 IU/L (ref 0–44)
AST: 19 IU/L (ref 0–40)
Albumin/Globulin Ratio: 2.4 — ABNORMAL HIGH (ref 1.2–2.2)
Albumin: 4.5 g/dL (ref 3.5–5.5)
Alkaline Phosphatase: 71 IU/L (ref 39–117)
BUN/Creatinine Ratio: 20 (ref 9–20)
BUN: 26 mg/dL — ABNORMAL HIGH (ref 6–24)
Bilirubin Total: 2 mg/dL — ABNORMAL HIGH (ref 0.0–1.2)
CO2: 21 mmol/L (ref 20–29)
Calcium: 9.6 mg/dL (ref 8.7–10.2)
Chloride: 99 mmol/L (ref 96–106)
Creatinine, Ser: 1.32 mg/dL — ABNORMAL HIGH (ref 0.76–1.27)
GFR calc Af Amer: 68 mL/min/{1.73_m2} (ref >59)
GFR calc non Af Amer: 59 mL/min/{1.73_m2} — ABNORMAL LOW (ref >59)
Globulin, Total: 1.9 g/dL (ref 1.5–4.5)
Glucose: 187 mg/dL — ABNORMAL HIGH (ref 65–99)
Potassium: 4.2 mmol/L (ref 3.5–5.2)
Sodium: 137 mmol/L (ref 134–144)
Total Protein: 6.4 g/dL (ref 6.0–8.5)

## 2018-02-18 LAB — CBC WITH DIFFERENTIAL
Basophils Absolute: 0 10*3/uL (ref 0.0–0.2)
Basos: 1 %
EOS (ABSOLUTE): 0.1 10*3/uL (ref 0.0–0.4)
Eos: 2 %
Hematocrit: 42.2 % (ref 37.5–51.0)
Hemoglobin: 15.2 g/dL (ref 13.0–17.7)
Immature Grans (Abs): 0 10*3/uL (ref 0.0–0.1)
Immature Granulocytes: 0 %
Lymphocytes Absolute: 1.4 10*3/uL (ref 0.7–3.1)
Lymphs: 27 %
MCH: 31.3 pg (ref 26.6–33.0)
MCHC: 36 g/dL — ABNORMAL HIGH (ref 31.5–35.7)
MCV: 87 fL (ref 79–97)
Monocytes Absolute: 0.5 10*3/uL (ref 0.1–0.9)
Monocytes: 9 %
Neutrophils Absolute: 3.2 10*3/uL (ref 1.4–7.0)
Neutrophils: 61 %
RBC: 4.86 x10E6/uL (ref 4.14–5.80)
RDW: 12.7 % (ref 12.3–15.4)
WBC: 5.2 10*3/uL (ref 3.4–10.8)

## 2018-03-15 MED FILL — metFORMIN HCL 1000 MG TABS: 1000 | 30 days supply | Qty: 60 | Fill #3

## 2018-03-31 ENCOUNTER — Ambulatory Visit (INDEPENDENT_AMBULATORY_CARE_PROVIDER_SITE_OTHER): Payer: Self-pay | Admitting: Family Medicine

## 2018-03-31 VITALS — BP 128/86 | HR 64 | Temp 97.9°F | Resp 14 | Ht 62.0 in | Wt 121.0 lb

## 2018-03-31 DIAGNOSIS — E119 Type 2 diabetes mellitus without complications: Secondary | ICD-10-CM

## 2018-03-31 DIAGNOSIS — Z23 Encounter for immunization: Secondary | ICD-10-CM

## 2018-03-31 DIAGNOSIS — Z794 Long term (current) use of insulin: Secondary | ICD-10-CM

## 2018-03-31 DIAGNOSIS — E1165 Type 2 diabetes mellitus with hyperglycemia: Secondary | ICD-10-CM

## 2018-03-31 LAB — POCT GLYCOSYLATED HEMOGLOBIN (HGB A1C): Hemoglobin A1C: 8.4 % — AB (ref 4.0–5.6)

## 2018-03-31 LAB — GLUCOSE, POCT (MANUAL RESULT ENTRY): POC Glucose: 248 mg/dl — AB (ref 70–99)

## 2018-03-31 MED ORDER — INSULIN ASPART 100 UNIT/ML FLEXPEN: 6.0000 [IU] | PEN_INJECTOR | Freq: Three times a day (TID) | SUBCUTANEOUS | 11 refills | Status: DC

## 2018-03-31 MED ORDER — PEN NEEDLES 30G X 8 MM MISC: 1.0000 | Freq: Four times a day (QID) | 3 refills | Status: DC

## 2018-03-31 MED ORDER — INSULIN GLARGINE 100 UNIT/ML ~~LOC~~ SOLN: 30.0000 [IU] | Freq: Every day | SUBCUTANEOUS | 11 refills | Status: DC

## 2018-03-31 MED FILL — NOVOLOG FLEXPEN SYRINGE: 100 | 16 days supply | Qty: 3 | Fill #0

## 2018-03-31 MED FILL — LANTUS 100 UNITS/ML VIAL: 100 | 28 days supply | Qty: 10 | Fill #0

## 2018-03-31 NOTE — Progress Notes (Signed)
[facility]  Diabetes Mellitus Type II, Follow-up Provider: Mike Gip, FNP   Patient here for follow-up of Type 2 diabetes mellitus.  Known diabetic complications: none Cardiovascular risk factors: advanced age (older than 61 for men, 54 for women), diabetes mellitus, dyslipidemia, family history of premature cardiovascular disease, hypertension and male gender Current diabetic medications include : Novolog and metformin. Patient states that he was unaware of lantus.   Eye exam current (within one year): no Weight trend: stable Prior visit with dietician: no Current diet: Patient states that he eat "all the fruits". He enjoys fresh fruits. limits rice and beans.  Current exercise: none  Current monitoring regimen: home blood tests - daily Home blood sugar records: fasting range: 200s Any episodes of hypoglycemia? no  Is He on ACE inhibitor or angiotensin II receptor blocker?  No  Low fat/carbohydrate diet?  No Nicotine Abuse?  No Medication Compliance?  Yes Exercise?  No Alcohol Abuse?  No Not checking FBS on a regular basis.    Review of Systems  Constitutional: Negative.   HENT: Negative.   Eyes: Negative.   Respiratory: Negative.   Cardiovascular: Negative.   Gastrointestinal: Negative.   Genitourinary: Negative.   Musculoskeletal: Negative.   Skin: Negative.   Neurological: Negative.   Psychiatric/Behavioral: Negative.      Lab Results  Component Value Date   HGBA1C 8.4 (A) 03/31/2018    Lab Results  Component Value Date   MICROALBUR 0.6 10/20/2015    Lab Results  Component Value Date   CHOL 208 (H) 10/31/2017   HDL 90 10/31/2017   LDLCALC 101 (H) 10/31/2017   TRIG 87 10/31/2017   CHOLHDL 2.3 10/31/2017     Physical Exam  Constitutional: He is oriented to person, place, and time and well-developed, well-nourished, and in no distress. No distress.  HENT:  Head: Normocephalic and atraumatic.    Eyes: Pupils are equal, round, and reactive to light. Conjunctivae and EOM are normal.  Neck: Normal range of motion. Neck supple.  Cardiovascular: Normal rate, regular rhythm and intact distal pulses. Exam reveals no gallop and no friction rub.  No murmur heard. Pulmonary/Chest: Effort normal and breath sounds normal. No respiratory distress. He has no wheezes.  Abdominal: Soft. Bowel sounds are normal. There is no tenderness.  Musculoskeletal: Normal range of motion. He exhibits no edema or tenderness.  Lymphadenopathy:    He has no cervical adenopathy.  Neurological: He is alert and oriented to person, place, and time. Gait normal.  Skin: Skin is warm and dry.  Psychiatric: Mood, memory, affect and judgment normal.  Nursing note and vitals reviewed.   1. Type 2 diabetes mellitus without complication, without long-term current use of insulin (HCC) Refilled medications and started on lantus.  - Glucose (CBG) - HgB A1c - Microalbumin, urine - Ambulatory referral to Ophthalmology - Comprehensive metabolic panel  2. Flu vaccine need Influenza vaccination given in the office visit today.  - Flu Vaccine QUAD 6+ mos PF IM (Fluarix Quad PF)  3. Type 2 diabetes mellitus with hyperglycemia, with long-term current use of insulin (HCC) - insulin aspart (NOVOLOG FLEXPEN) 100 UNIT/ML FlexPen; Inject 6 Units into the skin 3 (three) times daily with meals.  Dispense: 15 mL; Refill: 11 - insulin glargine (LANTUS) 100 UNIT/ML injection; Inject 0.3 mLs (30 Units total) into the skin at bedtime.  Dispense: 10 mL; Refill: 11  The patient is asked to make an attempt to improve diet and exercise  patterns to aid in medical management of this problem.   Ms. Freda Jacksonndr L. Riley Lamouglas, FNP-BC [facility] [address]  [place], [place] [id number] [phone]

## 2018-03-31 NOTE — Patient Instructions (Addendum)
I am sending your insulin to the pharmacy. It is called lantus. You will take 30 units at night before going to bed. The goal is to get your fasting blood sugars at 150 or below.  Continue with all of your other medications. I will contact you if there are any changes that need to be made once we get your lab results.   Insulin Glargine injection Qu es este medicamento? La INSULINA GLARGINA es una insulina de origen [place]. Este medicamento disminuye la cantidad de Banker. Esta insulina es una insulina de accin prolongada, que normalmente se administra una vez al da. Este medicamento puede ser utilizado para otros usos; si tiene alguna pregunta consulte con su proveedor de atencin mdica o con su farmacutico. MARCAS COMUNES: BASAGLAR, Lantus, Lantus SoloStar, Toujeo SoloStar Qu le debo informar a mi profesional de la salud antes de tomar este medicamento? Necesita saber si usted presenta alguno de los siguientes problemas o situaciones: -episodios de hipoglucemia -enfermedad renal -enfermedad heptica -una reaccin alrgica o inusual a la insulina, al metacresol, a otros medicamentos, alimentos, colorantes o conservantes -si est embarazada o buscando quedar embarazada -si est amamantando a un beb Cmo debo utilizar este medicamento? Este medicamento es para inyeccin por va subcutnea. Utilice este medicamento a la misma Economist. Utilice exactamente como se le haya indicado. Nunca debe mezclar esta insulina en la misma jeringa con otras insulinas antes de inyeccin. No agite vigorosamente la insulina antes de usar. Le ensearn cmo utilizar este medicamento y cmo [place] las dosis para actividades y [place]. No utilice ms insulina de la recetada. Siempre chequee el aspecto de su insulina antes de [place]. Este medicamento debe ser transparente y sin color como el agua. No lo utilice si est turbio, espeso, coloreado o si tiene partculas slidas. Es  importante que deseche las agujas y las jeringas usadas en un recipiente resistente a los pinchazos. No las deseche en una basura. Si no tiene un recipiente resistente a los pinchazos, consulte a Film/video editor o su proveedor de atencin para obtenerlo. Hable con su pediatra para informarse acerca del uso de este medicamento en nios. Puede requerir atencin especial. Sobredosis: Pngase en contacto inmediatamente con un centro toxicolgico o una sala de urgencia si usted cree que haya tomado demasiado medicamento. ATENCIN: [employer] es solo para usted. No comparta este medicamento con nadie. Qu sucede si me olvido de una dosis? Es importante de no olvidar una dosis. Su profesional de la salud o su mdico debe hablar con usted acerca de algn plan para dosis olvidadas. Si olvida una dosis, siga su plan. No use dosis dobles. Qu puede interactuar con este medicamento? -otros medicamentos para la diabetes Muchos medicamentos pueden aumentar o reducir Air cabin crew de Banker, tales como: -bebidas alcohlicas -aspirina y medicamentos tipo aspirina -cloranfenicol -cromo -diurticos -hormonas femeninas, como estrgenos, progestias o pldoras anticonceptivas -medicamentos para el corazn -isoniazida -hormonas masculinas o esteroides anablicos -medicamentos para bajar de peso -medicamentos para alergias, asma, resfros o tos -medicamentos para problemas mentales -medicamentos llamados inhibidores de la monoamina oxidasa (IMAO), tales como [place], Parnate, Marplan, Eldepryl -niacina -AINE, medicamentos para el dolor y la inflamacin, tales como ibuprofeno o naproxeno -pentamidina -fenitona -probenecid -antibiticos quinolnicos, tales como ciprofloxacina, levofloxacina, ofloxacina -algunos suplementos diteticos a base de hierbas -medicamentos esteroideos como la prednisona o la cortisona -medicamentos tiroideos Algunos medicamentos pueden disimular los sntomas de  advertencia de bajo nivel de Banker. Tendr que controlar atentamente su  nivel de azcar en la sangre si est tomando algunos de stos medicamentos, tales como: -beta-bloqueantes, tales como atenolol, metoprolol, propranolol -clonidina -guanetidina -reserpina Puede ser que esta lista no menciona todas las posibles interacciones. Informe a su profesional de Beazer Homesla salud de [employer] Inctodos los productos a base de hierbas, medicamentos de [place]venta libre o suplementos nutritivos que est tomando. Si usted fuma, consume bebidas alcohlicas o si utiliza drogas ilegales, indqueselo tambin a su profesional de Beazer Homesla salud. Algunas sustancias pueden interactuar con su medicamento. A qu debo estar atento al usar PPL Corporationeste medicamento? Visite a su mdico o a su profesional de la salud para chequear su evolucin peridicamente. No conduzca ni utilice maquinaria ni haga nada que Scientist, research (life sciences)le exija permanecer en estado de alerta hasta que sepa cmo le afecta este medicamento. El alcohol puede interferir con el efecto de [place]este medicamento. Evite las bebidas alcohlicas. Un examen llamado HbA1C (A1C) ser monitoreado. Es un simple examen de [place]ang[place]. Mide su control de azcar en la sangre durante los ltimos 2 a 3 meses. Usted recibir Medtroniceste examen cada 3 a 6 meses. Aprenda cmo controlar el nivel de azcar en la sangre. Aprenda a reconocer los sntomas de bajo y alto nivel de azcar en la sangre y cmo tratarlos. Siempre lleve consigo una fuente rpida de azcar por si acaso experimenta sntomas de bajo nivel de azcar en la sangre. Ejemplos incluyen caramelos duros o tabletas de glucosa. Asegrese de que los miembros de su familia sepan que se puede ahogar si come o bebe mientras tiene sntomas graves de bajo nivel de azcar en la sangre, tales como convulsiones o prdida del conocimiento. Deben obtener ayuda mdica inmediatamente. Informe a su mdico o a su profesional de la salud si tiene alto nivel de International aid/development workerazcar. Tal vez sea necesario  cambiar la dosis de su medicamento. Si est enfermo o haciendo mucho ms ejercicio que el habitual, puede ser necesario cambiar la dosis de su medicamento. No se salte comidas. Pregunte a su mdico o a su profesional de la salud si debe evitar el consumo de alcohol. Muchos productos de venta libre para tos y resfros contienen azcar y alcohol. Estos pueden Biochemist, clinicalafectar el nivel de azcar en la sangre. Asegrese que tiene la [place] correcta para el tipo de insulina que est utilizando. Trate de no Bhutancambiar de marca y tipo de Guaminsulina o Ni[place]eringa a menos que as lo indica su mdico o su profesional de Radiographer, therapeuticla salud. El Saint Barthelemycambiar de Lyonsmarcas o tipos puede causar un alto o bajo nivel peligroso de Bankerazcar en la sangre. Siempre lleve a mano un suministro adicional de [place] Dunseithinsulina, Cay[place] Islandsjeringas y [place] Vernonagujas. Utilice la jeringa sola una vez. Deseche la Kyung Ruddjeringa y [place] d'Ivo[place]aguja en un envase cerrado para prevenir pinchazos accidentales de agujas. Nunca debe compartir las plumas y los cartuchos. Aun si cambia la aguja, compartiendo puede resultar en la propagacin de virus, tales como hepatitis o VIH. Use una pulsera o cadena de identificacin mdica. Lleve consigo una tarjeta de identificacin con informacin sobre su enfermedad y Engineer, manufacturing systemsdetalles de sus medicamentos y los horarios de las dosis. Qu efectos secundarios puedo tener al Boston Scientificutilizar este medicamento? Efectos secundarios que debe informar a su mdico o a Producer, television/film/videosu profesional de la salud tan pronto como sea posible: Therapist, artreacciones alrgicas, como erupcin cutnea, picazn o urticarias, e hinchazn de la cara, los labios o la lengua problemas respiratorios signos y sntomas de niveles elevados de azcar en la sangre, tales como Table Rockmareo, boca seca, piel seca, aliento frutal, nuseas, dolor de Chesterestmago, Sri Lankaaumento del  apetito o la sed, aumento de la frecuencia urinaria signos y sntomas de niveles bajos de International aid/development worker en la sangre, tales como ansiedad, confusin, [place], aumento del apetito, debilidad o cansancio inusuales,  sudoracin, temblores, fro, irritabilidad, Engineer, mining de cabeza, visin borrosa, ritmo cardiaco rpido y prdida del conocimiento Efectos secundarios que generalmente no requieren atencin mdica (infrmelos a su mdico o a Producer, television/film/video de la salud si persisten o si son molestos): aumento o disminucin del tejido adiposo debajo de la piel debido al uso excesivo de un lugar de inyeccin en particular picazn, ardor, hinchazn o erupcin en el lugar de la inyeccin Puede ser que esta lista no menciona todos los posibles efectos secundarios. Comunquese a su mdico por asesoramiento mdico [employer]lett[employer]. Usted puede informar los efectos secundarios a la FDA por telfono al 1-800-FDA-1088. Dnde debo guardar mi medicina? Mantngala fuera del alcance de los nios. Guarde los frascos sin abrir en el refrigerador a una temperatura de [place] 2 y 8 grados C (36 y 62 grados F). No la congele o utilice si la insulina ha [place]. Los frascos abiertos (frascos que est utilizando actualmente) pueden guardarse en el refrigerador o a Publishing rights manager, aproximadamente a 25 grados C (77 grados F) o ms fresco. Pharmacologist su insulina a temperatura ambiente disminuye la cantidad de dolor que experimenta durante la inyeccin. Una vez abierta, la insulina se puede utilizar por [address] Despus de [address] deseche el frasco. Guarde las plumas de Lantus Solostar o plumas de Basaglar Kwik en el refrigerador a una temperatura de [place] 2 y 8 grados C (36 y 41 grados F) o a Publishing rights manager de menos de 30 grados C (86 grados F). No la congele o utilice si la insulina ha [place]. Una vez abiertas, las plumas se deben mantener a Publishing rights manager. No guardar en el refrigerador una vez abiertas. Una vez abierta, la insulina se puede utilizar durante [address] Despus de [address], debe desechar la pluma de Lantus Solostar o pluma de [place]. Guarde las plumas de Toujeo Solostar en el  refrigerador a una temperatura de [place] 2 y 8 grados C (36 y 57 grados F). No la congele o utilice si la insulina ha [place]. Una vez abiertas, las plumas se deben [address] a Marketing executive ambiente a menos de 30 grados C (86 grados F). No guardar en el refrigerador una vez abiertas. Una vez abierta, la insulina se puede utilizar por [address] Despus de [address], debe desechar la pluma de [employer]. Proteger todos los frascos y las plumas de insulina de la luz y Nurse, children's. Deseche todo el medicamento sin usar despus de la fecha de vencimiento o despus de transcurrido el tiempo especfico de almacenamiento a Publishing rights manager. ATENCIN: Este folleto es un resumen. Puede ser que no cubra toda la posible informacin. Si usted tiene preguntas acerca de esta medicina, consulte con su mdico, su farmacutico o su profesional de Radiographer, therapeutic.  2018 Elsevier/Gold Standard (2015-11-03 00:00:00)

## 2018-04-01 LAB — COMPREHENSIVE METABOLIC PANEL
ALT: 9 IU/L (ref 0–44)
AST: 12 IU/L (ref 0–40)
Albumin/Globulin Ratio: 2.4 — ABNORMAL HIGH (ref 1.2–2.2)
Albumin: 4.6 g/dL (ref 3.5–5.5)
Alkaline Phosphatase: 81 IU/L (ref 39–117)
BUN/Creatinine Ratio: 19 (ref 9–20)
BUN: 17 mg/dL (ref 6–24)
Bilirubin Total: 1.6 mg/dL — ABNORMAL HIGH (ref 0.0–1.2)
CO2: 24 mmol/L (ref 20–29)
Calcium: 10 mg/dL (ref 8.7–10.2)
Chloride: 104 mmol/L (ref 96–106)
Creatinine, Ser: 0.89 mg/dL (ref 0.76–1.27)
GFR calc Af Amer: 108 mL/min/{1.73_m2} (ref >59)
GFR calc non Af Amer: 94 mL/min/{1.73_m2} (ref >59)
Globulin, Total: 1.9 g/dL (ref 1.5–4.5)
Glucose: 151 mg/dL — ABNORMAL HIGH (ref 65–99)
Potassium: 4.5 mmol/L (ref 3.5–5.2)
Sodium: 143 mmol/L (ref 134–144)
Total Protein: 6.5 g/dL (ref 6.0–8.5)

## 2018-04-01 LAB — MICROALBUMIN, URINE: Microalbumin, Urine: 64.3 ug/mL

## 2018-04-05 ENCOUNTER — Encounter: Payer: Self-pay | Admitting: Family Medicine

## 2018-04-05 MED ORDER — LISINOPRIL 2.5 MG PO TABS: 2.5000 mg | ORAL_TABLET | Freq: Every day | ORAL | 1 refills | Status: DC

## 2018-04-20 MED FILL — NEO/POLY/DEXAMET EYE OINT: 3.5-10000-0 | 20 days supply | Qty: 4 | Fill #0

## 2018-04-26 MED FILL — NOVOLOG FLEXPEN SYRINGE: 100 | 16 days supply | Qty: 3 | Fill #1

## 2018-04-26 MED FILL — LANTUS 100 UNITS/ML VIAL: 100 | 28 days supply | Qty: 10 | Fill #1

## 2018-05-15 MED FILL — NOVOLOG FLEXPEN SYRINGE: 100 | 16 days supply | Qty: 3 | Fill #1

## 2018-06-01 ENCOUNTER — Emergency Department (HOSPITAL_COMMUNITY)
Admission: EM | Admit: 2018-06-01 | Discharge: 2018-06-01 | Disposition: A | Discharge status: Home / Self Care | Payer: Self-pay | Attending: Emergency Medicine | Admitting: Emergency Medicine

## 2018-06-01 ENCOUNTER — Other Ambulatory Visit: Payer: Self-pay

## 2018-06-01 ENCOUNTER — Encounter (HOSPITAL_COMMUNITY): Payer: Self-pay

## 2018-06-01 DIAGNOSIS — Z794 Long term (current) use of insulin: Secondary | ICD-10-CM | POA: Insufficient documentation

## 2018-06-01 DIAGNOSIS — Z87891 Personal history of nicotine dependence: Secondary | ICD-10-CM | POA: Insufficient documentation

## 2018-06-01 DIAGNOSIS — Z79899 Other long term (current) drug therapy: Secondary | ICD-10-CM | POA: Insufficient documentation

## 2018-06-01 DIAGNOSIS — M79604 Pain in right leg: Secondary | ICD-10-CM | POA: Insufficient documentation

## 2018-06-01 DIAGNOSIS — E119 Type 2 diabetes mellitus without complications: Secondary | ICD-10-CM | POA: Insufficient documentation

## 2018-06-01 DIAGNOSIS — I10 Essential (primary) hypertension: Secondary | ICD-10-CM | POA: Insufficient documentation

## 2018-06-01 MED ORDER — NAPROXEN 500 MG PO TABS: 500.0000 mg | ORAL_TABLET | Freq: Two times a day (BID) | ORAL | 0 refills | Status: DC

## 2018-06-01 MED FILL — NOVOLOG FLEXPEN SYRINGE: 100 | 16 days supply | Qty: 3 | Fill #2

## 2018-06-01 MED FILL — NAPROXEN 500 MG TABLET: 500 | 10 days supply | Qty: 20 | Fill #0

## 2018-06-01 NOTE — Discharge Instructions (Signed)
Take Naproxen twice a day for one week Use heat on the muscles to help with pain Please follow up with your doctor if you are not getting better

## 2018-06-01 NOTE — ED Provider Notes (Signed)
[facility]Y DEPARTMENT Provider Note   CSN: [id number] Arrival date & time: 06/01/18  4888     History   Chief Complaint Chief Complaint  Patient presents with  . Leg Pain    HPI Kevin Hubbard is a 57 y.o. male who presents with right thigh pain.  Past medical history significant for insulin-dependent diabetes and hypertension.  Language interpreter was used.  Patient states that he has had a " pimple" over the lateral aspect of the right thigh for 14 years.  He has not noticed it until recently however.  Over the past week he reports a diffuse pain in his anterior thigh which goes to the hip.  It is worse when he rubs the skin and it makes it feel irritated.  Feels like a burning sensation.  It hurts to lie on that side.  He is able to walk without any problems.  No fever or injury to the area.  He works in Architect.  Is not taking any medicine for symptoms.  HPI  Past Medical History:  Diagnosis Date  . Diabetes mellitus without complication [facility])     Patient Active Problem List   Diagnosis Date Noted  . Language barrier to communication 10/31/2017  . Elevated serum glucose with glucosuria 09/29/2016  . Diabetes mellitus ([place]) 09/01/2015    Past Surgical History:  Procedure Laterality Date  . ABDOMINAL SURGERY          Home Medications    Prior to Admission medications   Medication Sig Start Date End Date Taking? Authorizing Provider  acetaminophen (TYLENOL) 500 MG tablet Take 1 tablet (500 mg total) by mouth every 6 (six) hours as needed. Patient taking differently: Take 1,000 mg by mouth every 6 (six) hours as needed for mild pain or headache.  12/29/17   Dorena Dew, FNP  Blood Glucose Monitoring Suppl (TRUE METRIX AIR GLUCOSE METER) w/Device KIT 1 each by Does not apply route 4 (four) times daily -  with meals and at bedtime. 09/02/15   Dorena Dew, FNP  glucose blood (TRUE METRIX BLOOD GLUCOSE TEST) test strip Use as  instructed 10/31/17   Dorena Dew, FNP  insulin aspart (NOVOLOG FLEXPEN) 100 UNIT/ML FlexPen Inject 6 Units into the skin 3 (three) times daily with meals. 03/31/18   Lanae Boast, FNP  insulin glargine (LANTUS) 100 UNIT/ML injection Inject 0.3 mLs (30 Units total) into the skin at bedtime. 03/31/18   Lanae Boast, FNP  Insulin Pen Needle (PEN NEEDLES) 30G X 8 MM MISC 1 each by Does not apply route 4 (four) times daily. 03/31/18   Lanae Boast, FNP  Insulin Syringe-Needle U-100 Flossie Buffy INSULIN SYRINGE) 29G X 1/2" 0.3 ML MISC USE AS DIRECTED 4 TIMES DAILY WITH MEALS AND AT BEDTIME 10/31/17   Dorena Dew, FNP  lisinopril (ZESTRIL) 2.5 MG tablet Take 1 tablet (2.5 mg total) by mouth daily. 04/05/18   Lanae Boast, FNP  metFORMIN (GLUCOPHAGE) 1000 MG tablet TAKE 1 TABLET BY MOUTH 2 TIMES DAILY WITH A MEAL. 10/31/17   Dorena Dew, FNP  Multiple Vitamin (MULTIVITAMIN WITH MINERALS) TABS tablet Take 1 tablet by mouth daily.    [provider]  naproxen (NAPROSYN) 500 MG tablet Take 1 tablet (500 mg total) by mouth 2 (two) times daily. 06/01/18   Recardo Evangelist, PA-C    Family History No family history on file.  Social History Social History   Tobacco Use  . Smoking status: Former Smoker  Last attempt to quit: 07/17/2012    Years since quitting: 5.8  . Smokeless tobacco: Never Used  Substance Use Topics  . Alcohol use: No  . Drug use: No     Allergies   Patient has no known allergies.   Review of Systems Review of Systems  Musculoskeletal: Positive for myalgias.  Skin: Negative for wound.     Physical Exam Updated Vital Signs BP 126/79 (BP Location: Right Arm)   Pulse 96   Temp 98.7 F (37.1 C) (Oral)   Resp 16   SpO2 97%   Physical Exam  Constitutional: He is oriented to person, place, and time. He appears well-developed and well-nourished. No distress.  HENT:  Head: Normocephalic and atraumatic.  Eyes: Pupils are equal, round, and reactive  to light. Conjunctivae are normal. Right eye exhibits no discharge. Left eye exhibits no discharge. No scleral icterus.  Neck: Normal range of motion.  Cardiovascular: Normal rate.  Pulmonary/Chest: Effort normal. No respiratory distress.  Abdominal: He exhibits no distension.  Musculoskeletal:  Right thigh: No obvious swelling, deformity, or warmth. 1cm nodule over the lateral aspect of the thigh without significant tenderness. No redness or drainage from the area. Pt reports pain is over the anterior thigh but I cannot elicit tenderness. No knee tenderness. He is ambulatory without difficulty.    Neurological: He is alert and oriented to person, place, and time.  Skin: Skin is warm and dry.  Psychiatric: He has a normal mood and affect. His behavior is normal.  Nursing note and vitals reviewed.    ED Treatments / Results  Labs (all labs ordered are listed, but only abnormal results are displayed) Labs Reviewed - No data to display  EKG None  Radiology No results found.  Procedures Procedures (including critical care time)  Medications Ordered in ED Medications - No data to display   Initial Impression / Assessment and Plan / ED Course  I have reviewed the triage vital signs and the nursing notes.  Pertinent labs & imaging results that were available during my care of the patient were reviewed by me and considered in my medical decision making (see chart for details).  57 year old male presents with atraumatic right thigh pain.  There is no evidence of infection and pain is on the anterior so doubt DVT.  He states the pain is like a burning pain.  Most likely it is musculoskeletal. Advised Naproxen, heat, and f/u with PCP.  Final Clinical Impressions(s) / ED Diagnoses   Final diagnoses:  Right leg pain    ED Discharge Orders         Ordered    naproxen (NAPROSYN) 500 MG tablet  2 times daily     06/01/18 1138           Recardo Evangelist, PA-C 06/01/18 1143     Virgel Manifold, MD 06/01/18 1630

## 2018-06-01 NOTE — ED Triage Notes (Signed)
Pt presents for evaluation of upper right leg pain x 1 week. Reports it feels like its burning sometimes. States has a small bump to R lateral thigh. Pt denies fever or injury.

## 2018-07-05 ENCOUNTER — Ambulatory Visit: Payer: Self-pay | Admitting: Family Medicine

## 2018-07-10 MED FILL — metFORMIN HCL 1000 MG TABS: 1000 | 30 days supply | Qty: 60 | Fill #4

## 2018-07-10 MED FILL — NOVOLOG FLEXPEN SYRINGE: 100 | 16 days supply | Qty: 3 | Fill #3

## 2018-08-09 MED FILL — metFORMIN HCL 1000 MG TABS: 1000 | 30 days supply | Qty: 60 | Fill #5

## 2018-08-09 MED FILL — NOVOLOG FLEXPEN SYRINGE: 100 | 16 days supply | Qty: 3 | Fill #4

## 2018-09-07 MED FILL — NOVOLOG FLEXPEN SYRINGE: 100 | 33 days supply | Qty: 6 | Fill #5

## 2018-10-13 MED FILL — NOVOLOG FLEXPEN SYRINGE: 100 | 33 days supply | Qty: 6 | Fill #6

## 2018-12-18 ENCOUNTER — Other Ambulatory Visit: Payer: Self-pay | Admitting: Family Medicine

## 2018-12-18 DIAGNOSIS — E1165 Type 2 diabetes mellitus with hyperglycemia: Secondary | ICD-10-CM

## 2018-12-18 MED FILL — metFORMIN HCL 1000 MG TABS: 1000 | 30 days supply | Qty: 60 | Fill #0

## 2018-12-18 MED FILL — NOVOLOG FLEXPEN SYRINGE: 100 | 33 days supply | Qty: 6 | Fill #7

## 2019-02-16 MED FILL — LANTUS 100 UNITS/ML VIAL: 100 | 28 days supply | Qty: 10 | Fill #1

## 2019-02-16 MED FILL — NOVOLOG FLEXPEN SYRINGE: 100 | 33 days supply | Qty: 6 | Fill #8

## 2019-04-11 ENCOUNTER — Encounter (HOSPITAL_COMMUNITY): Payer: Self-pay

## 2019-04-11 ENCOUNTER — Encounter (HOSPITAL_COMMUNITY): Payer: Self-pay | Admitting: *Deleted

## 2019-05-02 ENCOUNTER — Other Ambulatory Visit: Payer: Self-pay | Admitting: Family Medicine

## 2019-05-02 DIAGNOSIS — E1165 Type 2 diabetes mellitus with hyperglycemia: Secondary | ICD-10-CM

## 2019-05-02 DIAGNOSIS — Z794 Long term (current) use of insulin: Secondary | ICD-10-CM

## 2019-05-07 ENCOUNTER — Other Ambulatory Visit: Payer: Self-pay

## 2019-05-07 ENCOUNTER — Encounter: Payer: Self-pay | Admitting: Family Medicine

## 2019-05-07 ENCOUNTER — Ambulatory Visit (INDEPENDENT_AMBULATORY_CARE_PROVIDER_SITE_OTHER): Payer: Self-pay | Admitting: Family Medicine

## 2019-05-07 VITALS — BP 127/57 | HR 68 | Temp 97.9°F | Ht 62.0 in | Wt 130.0 lb

## 2019-05-07 DIAGNOSIS — Z794 Long term (current) use of insulin: Secondary | ICD-10-CM

## 2019-05-07 DIAGNOSIS — E119 Type 2 diabetes mellitus without complications: Secondary | ICD-10-CM

## 2019-05-07 DIAGNOSIS — E1165 Type 2 diabetes mellitus with hyperglycemia: Secondary | ICD-10-CM

## 2019-05-07 DIAGNOSIS — Z09 Encounter for follow-up examination after completed treatment for conditions other than malignant neoplasm: Secondary | ICD-10-CM

## 2019-05-07 DIAGNOSIS — Z789 Other specified health status: Secondary | ICD-10-CM

## 2019-05-07 DIAGNOSIS — R739 Hyperglycemia, unspecified: Secondary | ICD-10-CM

## 2019-05-07 LAB — POCT URINALYSIS DIPSTICK
Bilirubin, UA: NEGATIVE
Blood, UA: NEGATIVE
Glucose, UA: POSITIVE — AB
Ketones, UA: NEGATIVE
Leukocytes, UA: NEGATIVE
Nitrite, UA: NEGATIVE
Protein, UA: POSITIVE — AB
Spec Grav, UA: 1.025 (ref 1.010–1.025)
Urobilinogen, UA: 1 E.U./dL
pH, UA: 6 (ref 5.0–8.0)

## 2019-05-07 LAB — GLUCOSE, POCT (MANUAL RESULT ENTRY): POC Glucose: 264 mg/dl — AB (ref 70–99)

## 2019-05-07 MED ORDER — LANTUS SOLOSTAR 100 UNIT/ML ~~LOC~~ SOPN: 30.0000 [IU] | PEN_INJECTOR | Freq: Every day | SUBCUTANEOUS | 99 refills | Status: DC

## 2019-05-07 NOTE — Progress Notes (Signed)
Patient [facility]    Established Patient Office Visit  Subjective:  Patient ID: Kevin Hubbard, male    DOB: 05-Feb-1961  Age: 58 y.o. MRN: [id number]  CC:  Chief Complaint  Patient presents with  . Follow-up    DM follow up    HPI Kevin Hubbard is a 58 year old male who presents for Follow Up today.  Past Medical History:  Diagnosis Date  . Diabetes mellitus without complication ([place])    Current Status: Since his last office visit, he is doing well with no complaints. Blood glucose is elevated today. Patient states that his has not been using Lantus because he does not like drawing up insulin and would like to have Lantus Pen. His most recent normal range of preprandial blood glucose levels have been between 150-200. He has seen low range of 80 and high of 250 since his last office visit. He denies fatigue, frequent urination, blurred vision, excessive hunger, excessive thirst, weight gain, weight loss, and poor wound healing. He continues to check his feet regularly. He denies fevers, chills, recent infections, weight loss, and night sweats. He has not had any headaches, dizziness, and falls. No chest pain, heart palpitations, cough and shortness of breath reported. No reports of GI problems such as nausea, vomiting, diarrhea, and constipation. He has no reports of blood in stools, dysuria and hematuria. No depression or anxiety reported today. He denies pain today.   Past Surgical History:  Procedure Laterality Date  . ABDOMINAL SURGERY      History reviewed. No pertinent family history.  Social History   Socioeconomic History  . Marital status: Single    Spouse name: Not on file  . Number of children: Not on file  . Years of education: Not on file  . Highest education level: Not on file  Occupational History  . Not on file  Social Needs  . Financial resource strain: Not on file  . Food insecurity    Worry: Not on file   Inability: Not on file  . Transportation needs    Medical: Not on file    Non-medical: Not on file  Tobacco Use  . Smoking status: Former Smoker    Quit date: 07/17/2012    Years since quitting: 6.8  . Smokeless tobacco: Never Used  Substance and Sexual Activity  . Alcohol use: No  . Drug use: No  . Sexual activity: Not on file  Lifestyle  . Physical activity    Days per week: Not on file    Minutes per session: Not on file  . Stress: Not on file  Relationships  . Social Herbalist on phone: Not on file    Gets together: Not on file    Attends religious service: Not on file    Active member of club or organization: Not on file    Attends meetings of clubs or organizations: Not on file    Relationship status: Not on file  . Intimate partner violence    Fear of current or ex partner: Not on file    Emotionally abused: Not on file    Physically abused: Not on file    Forced sexual activity: Not on file  Other Topics Concern  . Not on file  Social History Narrative  . Not on file    Outpatient Medications Prior to Visit  Medication Sig Dispense Refill  . Blood Glucose Monitoring Suppl (TRUE METRIX AIR GLUCOSE  METER) w/Device KIT 1 each by Does not apply route 4 (four) times daily -  with meals and at bedtime. 1 kit 0  . glucose blood (TRUE METRIX BLOOD GLUCOSE TEST) test strip Use as instructed 100 each 12  . insulin aspart (NOVOLOG FLEXPEN) 100 UNIT/ML FlexPen Inject 6 Units into the skin 3 (three) times daily with meals. 15 mL 11  . Insulin Pen Needle (PEN NEEDLES) 30G X 8 MM MISC 1 each by Does not apply route 4 (four) times daily. 100 each 3  . Insulin Syringe-Needle U-100 (ULTICARE INSULIN SYRINGE) 29G X 1/2" 0.3 ML MISC USE AS DIRECTED 4 TIMES DAILY WITH MEALS AND AT BEDTIME 100 each 5  . lisinopril (ZESTRIL) 2.5 MG tablet Take 1 tablet (2.5 mg total) by mouth daily. 90 tablet 1  . metFORMIN (GLUCOPHAGE) 1000 MG tablet TAKE 1 TABLET BY MOUTH 2 TIMES DAILY WITH  A MEAL. 60 tablet 5  . Multiple Vitamin (MULTIVITAMIN WITH MINERALS) TABS tablet Take 1 tablet by mouth daily.    . naproxen (NAPROSYN) 500 MG tablet Take 1 tablet (500 mg total) by mouth 2 (two) times daily. 20 tablet 0  . acetaminophen (TYLENOL) 500 MG tablet Take 1 tablet (500 mg total) by mouth every 6 (six) hours as needed. (Patient taking differently: Take 1,000 mg by mouth every 6 (six) hours as needed for mild pain or headache. ) 30 tablet 1  . insulin glargine (LANTUS) 100 UNIT/ML injection Inject 0.3 mLs (30 Units total) into the skin at bedtime. (Patient not taking: Reported on 05/07/2019) 10 mL 11   No facility-administered medications prior to visit.     No Known Allergies  ROS Review of Systems  Constitutional: Positive for fatigue.  HENT: Negative.   Eyes: Negative.   Respiratory: Negative.   Cardiovascular: Negative.   Gastrointestinal: Negative.   Endocrine: Negative.   Genitourinary: Negative.   Musculoskeletal: Positive for arthralgias (generalized).  Skin: Negative.   Allergic/Immunologic: Negative.   Neurological: Negative.   Hematological: Negative.   Psychiatric/Behavioral: Negative.       Objective:    Physical Exam  Constitutional: He is oriented to person, place, and time. He appears well-developed and well-nourished.  HENT:  Head: Normocephalic and atraumatic.  Eyes: Conjunctivae are normal.  Neck: Normal range of motion. Neck supple.  Cardiovascular: Normal rate, regular rhythm, normal heart sounds and intact distal pulses.  Pulmonary/Chest: Effort normal and breath sounds normal.  Abdominal: Soft. Bowel sounds are normal.  Musculoskeletal: Normal range of motion.  Neurological: He is alert and oriented to person, place, and time. He has normal reflexes.  Skin: Skin is warm and dry.  Psychiatric: He has a normal mood and affect. His behavior is normal. Judgment and thought content normal.  Nursing note and vitals reviewed.   BP (!) 127/57 (BP  Location: Left Arm, Patient Position: Sitting, Cuff Size: Normal)   Pulse 68   Temp 97.9 F (36.6 C) (Oral)   Ht 5' 2"  (1.575 m)   Wt 130 lb (59 kg)   SpO2 100%   BMI 23.78 kg/m  Wt Readings from Last 3 Encounters:  05/07/19 130 lb (59 kg)  03/31/18 121 lb (54.9 kg)  12/29/17 120 lb (54.4 kg)     Health Maintenance Due  Topic Date Due  . OPHTHALMOLOGY EXAM  12/07/1968  . HEMOGLOBIN A1C  09/30/2018  . FOOT EXAM  11/01/2018    There are no preventive care reminders to display for this patient.  Lab Results  Component Value Date   TSH 1.810 05/07/2019   Lab Results  Component Value Date   WBC 5.0 05/07/2019   HGB 16.3 05/07/2019   HCT 46.0 05/07/2019   MCV 93 05/07/2019   PLT 174 05/07/2019   Lab Results  Component Value Date   NA 139 05/07/2019   K 4.7 05/07/2019   CO2 23 05/07/2019   GLUCOSE 258 (H) 05/07/2019   BUN 14 05/07/2019   CREATININE 0.97 05/07/2019   BILITOT 1.2 05/07/2019   ALKPHOS 76 05/07/2019   AST 18 05/07/2019   ALT 14 05/07/2019   PROT 6.6 05/07/2019   ALBUMIN 4.6 05/07/2019   CALCIUM 9.8 05/07/2019   ANIONGAP 12 02/14/2018   Lab Results  Component Value Date   CHOL 208 (H) 10/31/2017   Lab Results  Component Value Date   HDL 90 10/31/2017   Lab Results  Component Value Date   LDLCALC 101 (H) 10/31/2017   Lab Results  Component Value Date   TRIG 87 10/31/2017   Lab Results  Component Value Date   CHOLHDL 2.3 10/31/2017   Lab Results  Component Value Date   HGBA1C 8.4 (H) 05/07/2019      Assessment & Plan:   1. Type 2 diabetes mellitus without complication, without long-term current use of insulin (HCC) Hgb A1c is stable at 8.4 today. He will continue medication as prescribed, to decrease foods/beverages high in sugars and carbs and follow Heart Healthy or DASH diet. Increase physical activity to at least 30 minutes cardio exercise daily. - POCT glucose (manual entry) - POCT urinalysis dipstick - Insulin Glargine  (LANTUS SOLOSTAR) 100 UNIT/ML Solostar Pen; Inject 30 Units into the skin at bedtime.  Dispense: 5 pen; Refill: PRN  2. Language barrier to communication  3. Type 2 diabetes mellitus with hyperglycemia, with long-term current use of insulin (HCC) - Hemoglobin A1c - CBC with Differential - Comprehensive metabolic panel - PSA - TSH - Vitamin B12 - Vitamin D, 25-hydroxy - Insulin Glargine (LANTUS SOLOSTAR) 100 UNIT/ML Solostar Pen; Inject 30 Units into the skin at bedtime.  Dispense: 5 pen; Refill: PRN  4. Hyperglycemia Glucose levels are increased at 264 today. He will continue medication as prescribed, to decrease foods/beverages high in sugars and carbs and follow Heart Healthy or DASH diet. Increase physical activity to at least 30 minutes cardio exercise daily.   5. Follow up He will follow up in 3 months.   Meds ordered this encounter  Medications  . Insulin Glargine (LANTUS SOLOSTAR) 100 UNIT/ML Solostar Pen    Sig: Inject 30 Units into the skin at bedtime.    Dispense:  5 pen    Refill:  PRN    Orders Placed This Encounter  Procedures  . Hemoglobin A1c  . CBC with Differential  . Comprehensive metabolic panel  . PSA  . TSH  . Vitamin B12  . Vitamin D, 25-hydroxy  . POCT glucose (manual entry)  . POCT urinalysis dipstick    Referral Orders  No referral(s) requested today    Kathe Becton,  MSN, FNP-BC [address] [place], [place] [address] [phone] fax  Problem List Items Addressed This Visit      Endocrine   Diabetes mellitus ([place]) - Primary   Relevant Medications   Insulin Glargine (LANTUS SOLOSTAR) 100 UNIT/ML Solostar Pen   Other Relevant Orders   Hemoglobin A1c (Completed)   CBC with Differential (Completed)  Comprehensive metabolic panel (Completed)   PSA (Completed)   TSH (Completed)   Vitamin B12 (Completed)   Vitamin D, 25-hydroxy  (Completed)   POCT glucose (manual entry) (Completed)   POCT urinalysis dipstick (Completed)     Other   Language barrier to communication    Other Visit Diagnoses    Hyperglycemia       Follow up          Meds ordered this encounter  Medications  . Insulin Glargine (LANTUS SOLOSTAR) 100 UNIT/ML Solostar Pen    Sig: Inject 30 Units into the skin at bedtime.    Dispense:  5 pen    Refill:  PRN    Follow-up: Return in about 3 months (around 08/07/2019).    Azzie Glatter, FNP

## 2019-05-08 LAB — CBC WITH DIFFERENTIAL/PLATELET
Basophils Absolute: 0 10*3/uL (ref 0.0–0.2)
Basos: 1 %
EOS (ABSOLUTE): 0.1 10*3/uL (ref 0.0–0.4)
Eos: 2 %
Hematocrit: 46 % (ref 37.5–51.0)
Hemoglobin: 16.3 g/dL (ref 13.0–17.7)
Immature Grans (Abs): 0 10*3/uL (ref 0.0–0.1)
Immature Granulocytes: 0 %
Lymphocytes Absolute: 1.2 10*3/uL (ref 0.7–3.1)
Lymphs: 24 %
MCH: 33.1 pg — ABNORMAL HIGH (ref 26.6–33.0)
MCHC: 35.4 g/dL (ref 31.5–35.7)
MCV: 93 fL (ref 79–97)
Monocytes Absolute: 0.4 10*3/uL (ref 0.1–0.9)
Monocytes: 8 %
Neutrophils Absolute: 3.3 10*3/uL (ref 1.4–7.0)
Neutrophils: 65 %
Platelets: 174 10*3/uL (ref 150–450)
RBC: 4.93 x10E6/uL (ref 4.14–5.80)
RDW: 11.7 % (ref 11.6–15.4)
WBC: 5 10*3/uL (ref 3.4–10.8)

## 2019-05-08 LAB — COMPREHENSIVE METABOLIC PANEL
ALT: 14 IU/L (ref 0–44)
AST: 18 IU/L (ref 0–40)
Albumin/Globulin Ratio: 2.3 — ABNORMAL HIGH (ref 1.2–2.2)
Albumin: 4.6 g/dL (ref 3.8–4.9)
Alkaline Phosphatase: 76 IU/L (ref 39–117)
BUN/Creatinine Ratio: 14 (ref 10–24)
BUN: 14 mg/dL (ref 8–27)
Bilirubin Total: 1.2 mg/dL (ref 0.0–1.2)
CO2: 23 mmol/L (ref 20–29)
Calcium: 9.8 mg/dL (ref 8.6–10.2)
Chloride: 103 mmol/L (ref 96–106)
Creatinine, Ser: 0.97 mg/dL (ref 0.76–1.27)
GFR calc Af Amer: 98 mL/min/{1.73_m2} (ref >59)
GFR calc non Af Amer: 84 mL/min/{1.73_m2} (ref >59)
Globulin, Total: 2 g/dL (ref 1.5–4.5)
Glucose: 258 mg/dL — ABNORMAL HIGH (ref 65–99)
Potassium: 4.7 mmol/L (ref 3.5–5.2)
Sodium: 139 mmol/L (ref 134–144)
Total Protein: 6.6 g/dL (ref 6.0–8.5)

## 2019-05-08 LAB — HEMOGLOBIN A1C
Est. average glucose Bld gHb Est-mCnc: 194 mg/dL
Hgb A1c MFr Bld: 8.4 % — ABNORMAL HIGH (ref 4.8–5.6)

## 2019-05-08 LAB — TSH: TSH: 1.81 u[IU]/mL (ref 0.450–4.500)

## 2019-05-08 LAB — VITAMIN D 25 HYDROXY (VIT D DEFICIENCY, FRACTURES): Vit D, 25-Hydroxy: 26.7 ng/mL — ABNORMAL LOW (ref 30.0–100.0)

## 2019-05-08 LAB — PSA: Prostate Specific Ag, Serum: 0.9 ng/mL (ref 0.0–4.0)

## 2019-05-08 LAB — VITAMIN B12: Vitamin B-12: 484 pg/mL (ref 232–1245)

## 2019-05-08 MED FILL — !LANTUS SOLOSTAR 100UNITS/M: 100 | 29 days supply | Qty: 9 | Fill #0

## 2019-08-07 ENCOUNTER — Ambulatory Visit: Payer: Self-pay | Admitting: Family Medicine

## 2019-08-16 MED FILL — !LANTUS SOLOSTAR 100UNITS/M: 100 | 29 days supply | Qty: 9 | Fill #1

## 2020-02-18 ENCOUNTER — Other Ambulatory Visit: Payer: Self-pay | Admitting: Family Medicine

## 2020-02-18 DIAGNOSIS — Z794 Long term (current) use of insulin: Secondary | ICD-10-CM

## 2020-02-18 MED FILL — metFORMIN HCL 1000 MG TABS: 1000 | 30 days supply | Qty: 60 | Fill #0

## 2020-02-18 MED FILL — !LANTUS SOLOSTAR 100UNITS/M: 100 | 29 days supply | Qty: 9 | Fill #2

## 2020-11-26 ENCOUNTER — Other Ambulatory Visit: Payer: Self-pay

## 2020-11-26 ENCOUNTER — Ambulatory Visit: Payer: Self-pay | Attending: Critical Care Medicine

## 2020-11-26 DIAGNOSIS — Z23 Encounter for immunization: Secondary | ICD-10-CM

## 2020-11-26 NOTE — Progress Notes (Signed)
   Covid-19 Vaccination Clinic  Name:  Kevin Hubbard    MRN: [id number] DOB: 10-Aug-1960  11/26/2020  Kevin Hubbard was observed post Covid-19 immunization for 15 minutes without incident. He was provided with Vaccine Information Sheet and instruction to access the V-Safe system.   Kevin Hubbard was instructed to call 911 with any severe reactions post vaccine: Marland Kitchen Difficulty breathing  . Swelling of face and throat  . A fast heartbeat  . A bad rash all over body  . Dizziness and weakness   Immunizations Administered    Name Date Dose VIS Date Route   Moderna Covid-19 Booster Vaccine 11/26/2020  2:30 PM 0.25 mL 05/21/2020 Intramuscular   Manufacturer: Moderna   Lot: [id number]   NDC: 87867-672-09

## 2021-01-06 DIAGNOSIS — H268 Other specified cataract: Secondary | ICD-10-CM | POA: Insufficient documentation

## 2021-01-06 DIAGNOSIS — H2511 Age-related nuclear cataract, right eye: Secondary | ICD-10-CM | POA: Insufficient documentation

## 2021-06-01 ENCOUNTER — Emergency Department (HOSPITAL_COMMUNITY)
Admission: EM | Admit: 2021-06-01 | Discharge: 2021-06-01 | Disposition: A | Discharge status: Home / Self Care | Payer: Self-pay | Attending: Emergency Medicine | Admitting: Emergency Medicine

## 2021-06-01 ENCOUNTER — Other Ambulatory Visit: Payer: Self-pay

## 2021-06-01 ENCOUNTER — Emergency Department (HOSPITAL_COMMUNITY): Payer: Self-pay

## 2021-06-01 DIAGNOSIS — M25532 Pain in left wrist: Secondary | ICD-10-CM | POA: Insufficient documentation

## 2021-06-01 DIAGNOSIS — W108XXA Fall (on) (from) other stairs and steps, initial encounter: Secondary | ICD-10-CM | POA: Insufficient documentation

## 2021-06-01 DIAGNOSIS — S63502A Unspecified sprain of left wrist, initial encounter: Secondary | ICD-10-CM | POA: Insufficient documentation

## 2021-06-01 DIAGNOSIS — Z794 Long term (current) use of insulin: Secondary | ICD-10-CM | POA: Insufficient documentation

## 2021-06-01 DIAGNOSIS — Y9301 Activity, walking, marching and hiking: Secondary | ICD-10-CM | POA: Insufficient documentation

## 2021-06-01 DIAGNOSIS — Z7984 Long term (current) use of oral hypoglycemic drugs: Secondary | ICD-10-CM | POA: Insufficient documentation

## 2021-06-01 DIAGNOSIS — Z87891 Personal history of nicotine dependence: Secondary | ICD-10-CM | POA: Insufficient documentation

## 2021-06-01 DIAGNOSIS — E119 Type 2 diabetes mellitus without complications: Secondary | ICD-10-CM | POA: Insufficient documentation

## 2021-06-01 MED ORDER — IBUPROFEN 600 MG PO TABS: 600.0000 mg | ORAL_TABLET | Freq: Four times a day (QID) | ORAL | 0 refills | Status: DC | PRN

## 2021-06-01 MED ORDER — CYCLOBENZAPRINE HCL 10 MG PO TABS: 10.0000 mg | ORAL_TABLET | Freq: Two times a day (BID) | ORAL | 0 refills | Status: DC | PRN

## 2021-06-01 NOTE — Discharge Instructions (Addendum)
You have been evaluated for your left wrist injury.  Fortunately x-ray did not show any evidence of any broken bone.  This is likely a sprain.  Please wear wrist brace for support.  Follow instruction below.  You may follow-up with hand specialist as needed.

## 2021-06-01 NOTE — ED Triage Notes (Signed)
Pt reports falling last Thursday and has L wrist pain and swelling from same.

## 2021-06-01 NOTE — ED Notes (Signed)
Pt verbalizes understanding of discharge instructions. Opportunity for questions and answers were provided. Pt discharged from the ED.   ?

## 2021-06-01 NOTE — ED Provider Notes (Signed)
[facility] EMERGENCY DEPARTMENT Provider Note   CSN: [id number] Arrival date & time: 06/01/21  1145     History Chief Complaint  Patient presents with   Wrist Pain    Kevin Hubbard is a 60 y.o. male.  The history is provided by the patient. No language interpreter was used.  Wrist Pain   60 year old male who presents for evaluation of left wrist injury.  Patient report 4 days ago he was walking down the steps, missed stepped and fell having to reach his left dominant hand to break the fall.  He reported acute onset of sharp throbbing pain about the wrist that has increased in severity.  Pain radiates towards his forearm, and last night he noticed swelling associated with it.  He tries using warm compress with some improvement.  He denies any pain to his fingers, elbow, or shoulder.  He denies any numbness.  No other injury.  Past Medical History:  Diagnosis Date   Diabetes mellitus without complication [facility])     Patient Active Problem List   Diagnosis Date Noted   Language barrier to communication 10/31/2017   Elevated serum glucose with glucosuria 09/29/2016   Diabetes mellitus ([place]) 09/01/2015    Past Surgical History:  Procedure Laterality Date   ABDOMINAL SURGERY         No family history on file.  Social History   Tobacco Use   Smoking status: Former    Types: Cigarettes    Quit date: 07/17/2012    Years since quitting: 8.8   Smokeless tobacco: Never  Substance Use Topics   Alcohol use: No   Drug use: No    Home Medications Prior to Admission medications   Medication Sig Start Date End Date Taking? Authorizing Provider  Blood Glucose Monitoring Suppl (TRUE METRIX AIR GLUCOSE METER) w/Device KIT 1 each by Does not apply route 4 (four) times daily -  with meals and at bedtime. 09/02/15   Dorena Dew, FNP  glucose blood (TRUE METRIX BLOOD GLUCOSE TEST) test strip Use as instructed 10/31/17   Dorena Dew, FNP  insulin aspart  (NOVOLOG FLEXPEN) 100 UNIT/ML FlexPen Inject 6 Units into the skin 3 (three) times daily with meals. 03/31/18   Lanae Boast, FNP  Insulin Glargine (LANTUS SOLOSTAR) 100 UNIT/ML Solostar Pen Inject 30 Units into the skin at bedtime. 05/07/19   Azzie Glatter, FNP  Insulin Pen Needle (PEN NEEDLES) 30G X 8 MM MISC 1 each by Does not apply route 4 (four) times daily. 03/31/18   Lanae Boast, FNP  Insulin Syringe-Needle U-100 Flossie Buffy INSULIN SYRINGE) 29G X 1/2" 0.3 ML MISC USE AS DIRECTED 4 TIMES DAILY WITH MEALS AND AT BEDTIME 10/31/17   Dorena Dew, FNP  lisinopril (ZESTRIL) 2.5 MG tablet Take 1 tablet (2.5 mg total) by mouth daily. 04/05/18   Lanae Boast, FNP  metFORMIN (GLUCOPHAGE) 1000 MG tablet TAKE 1 TABLET BY MOUTH 2 TIMES DAILY WITH A MEAL. 02/18/20   Azzie Glatter, FNP  Multiple Vitamin (MULTIVITAMIN WITH MINERALS) TABS tablet Take 1 tablet by mouth daily.    [provider]  naproxen (NAPROSYN) 500 MG tablet Take 1 tablet (500 mg total) by mouth 2 (two) times daily. 06/01/18   Recardo Evangelist, PA-C    Allergies    Patient has no known allergies.  Review of Systems   Review of Systems  Constitutional:  Negative for fever.  Musculoskeletal:  Positive for arthralgias.  Skin:  Negative for wound.  Neurological:  Negative for numbness.   Physical Exam Updated Vital Signs BP (!) 161/86 (BP Location: Right Arm)   Pulse 75   Temp 98.4 F (36.9 C) (Oral)   Resp 16   SpO2 99%   Physical Exam Vitals and nursing note reviewed.  Constitutional:      General: He is not in acute distress.    Appearance: He is well-developed.  HENT:     Head: Atraumatic.  Eyes:     Conjunctiva/sclera: Conjunctivae normal.  Musculoskeletal:        General: Signs of injury (Left wrist: Edema and tenderness noted to the radial aspect of the wrist and along the thenar eminence with decreased wrist flexion.  Normal supination and pronation.  Radial pulse 2+.  Fingers nontender.   Left elbow and left shoulder nontender.) present.     Cervical back: Neck supple.  Skin:    Findings: No rash.  Neurological:     Mental Status: He is alert.    ED Results / Procedures / Treatments   Labs (all labs ordered are listed, but only abnormal results are displayed) Labs Reviewed - No data to display  EKG None  Radiology DG Wrist Complete Left  Result Date: 06/01/2021 CLINICAL DATA:  Fall 4 days prior, left wrist and first metacarpal pain and swelling EXAM: LEFT WRIST - COMPLETE 3+ VIEW COMPARISON:  None. FINDINGS: No fracture or dislocation. Mild first carpometacarpal joint osteoarthritis. No suspicious focal osseous lesions. Vascular calcifications in the soft tissues. No radiopaque foreign bodies. IMPRESSION: No fracture or dislocation. Mild first carpometacarpal joint osteoarthritis. Electronically Signed   By: Ilona Sorrel M.D.   On: 06/01/2021 12:18    Procedures Procedures   Medications Ordered in ED Medications - No data to display  ED Course  I have reviewed the triage vital signs and the nursing notes.  Pertinent labs & imaging results that were available during my care of the patient were reviewed by me and considered in my medical decision making (see chart for details).    MDM Rules/Calculators/A&P                           BP (!) 161/86 (BP Location: Right Arm)   Pulse 75   Temp 98.4 F (36.9 C) (Oral)   Resp 16   SpO2 99%   Final Clinical Impression(s) / ED Diagnoses Final diagnoses:  Sprain of left wrist, initial encounter    Rx / DC Orders ED Discharge Orders     None      3:17 PM Patient here with a [place] injury to his left wrist.  Fortunately x-ray of the left wrist did not demonstrate any acute fracture or dislocation.  This is likely a sprain.  Wrist brace provided, RICE therapy discussed.  Hand specialist referral given as needed.  Patient is neurovascular intact.   Domenic Moras, PA-C 06/01/21 [place]    Davonna Belling,  MD 06/02/21 [phone]

## 2021-07-16 ENCOUNTER — Other Ambulatory Visit: Payer: Self-pay

## 2021-07-21 ENCOUNTER — Ambulatory Visit (INDEPENDENT_AMBULATORY_CARE_PROVIDER_SITE_OTHER): Payer: Self-pay | Admitting: Nurse Practitioner

## 2021-07-21 ENCOUNTER — Encounter: Payer: Self-pay | Admitting: Nurse Practitioner

## 2021-07-21 ENCOUNTER — Other Ambulatory Visit: Payer: Self-pay

## 2021-07-21 VITALS — BP 127/76 | HR 67 | Temp 97.2°F | Ht 60.0 in | Wt 116.0 lb

## 2021-07-21 DIAGNOSIS — E1165 Type 2 diabetes mellitus with hyperglycemia: Secondary | ICD-10-CM

## 2021-07-21 DIAGNOSIS — Z794 Long term (current) use of insulin: Secondary | ICD-10-CM

## 2021-07-21 DIAGNOSIS — E119 Type 2 diabetes mellitus without complications: Secondary | ICD-10-CM

## 2021-07-21 LAB — POCT URINALYSIS DIP (CLINITEK)
Bilirubin, UA: NEGATIVE
Blood, UA: NEGATIVE
Glucose, UA: 500 mg/dL — AB
Ketones, POC UA: NEGATIVE mg/dL
Leukocytes, UA: NEGATIVE
Nitrite, UA: NEGATIVE
POC PROTEIN,UA: 100 — AB
Spec Grav, UA: >=1.03 — AB (ref 1.010–1.025)
Urobilinogen, UA: 1 E.U./dL
pH, UA: 6 (ref 5.0–8.0)

## 2021-07-21 LAB — GLUCOSE, POCT (MANUAL RESULT ENTRY): POC Glucose: 259 mg/dl — AB (ref 70–99)

## 2021-07-21 LAB — POCT GLYCOSYLATED HEMOGLOBIN (HGB A1C)
HbA1c POC (<> result, manual entry): 9.1 % (ref 4.0–5.6)
HbA1c, POC (controlled diabetic range): 9.1 % — AB (ref 0.0–7.0)
HbA1c, POC (prediabetic range): 9.1 % — AB (ref 5.7–6.4)
Hemoglobin A1C: 9.1 % — AB (ref 4.0–5.6)

## 2021-07-21 MED ORDER — "INSULIN SYRINGE-NEEDLE U-100 31G X 5/16"" 0.3 ML MISC"
5 refills | Status: DC
  Filled 2021-07-21: qty 100, 25d supply, fill #0

## 2021-07-21 MED ORDER — TRUE METRIX METER W/DEVICE KIT
1.0000 | PACK | Freq: Three times a day (TID) | 0 refills | Status: AC
  Filled 2021-07-21: qty 1, 365d supply, fill #0

## 2021-07-21 MED ORDER — TRUE METRIX BLOOD GLUCOSE TEST VI STRP
ORAL_STRIP | 12 refills | Status: DC
  Filled 2021-07-21 – 2021-08-26 (×2): qty 100, 25d supply, fill #0
  Filled 2022-01-11: qty 100, 25d supply, fill #1
  Filled 2022-07-02: qty 100, 25d supply, fill #2

## 2021-07-21 MED ORDER — INSULIN PEN NEEDLE 31G X 8 MM MISC
1.0000 | Freq: Four times a day (QID) | 3 refills | Status: DC
  Filled 2021-07-21: qty 100, 25d supply, fill #0

## 2021-07-21 MED ORDER — LANTUS SOLOSTAR 100 UNIT/ML ~~LOC~~ SOPN
30.0000 [IU] | PEN_INJECTOR | Freq: Every day | SUBCUTANEOUS | 2 refills | Status: DC
  Filled 2021-07-21: qty 9, 30d supply, fill #0

## 2021-07-21 MED ORDER — LISINOPRIL 2.5 MG PO TABS
2.5000 mg | ORAL_TABLET | Freq: Every day | ORAL | 1 refills | Status: DC
  Filled 2021-07-21 – 2021-11-10 (×2): qty 30, 30d supply, fill #0
  Filled 2022-01-11: qty 30, 30d supply, fill #1

## 2021-07-21 MED ORDER — NOVOLOG FLEXPEN 100 UNIT/ML ~~LOC~~ SOPN
6.0000 [IU] | PEN_INJECTOR | Freq: Three times a day (TID) | SUBCUTANEOUS | 11 refills | Status: DC
  Filled 2021-07-21: qty 6, 33d supply, fill #0

## 2021-07-21 MED ORDER — METFORMIN HCL 1000 MG PO TABS
1000.0000 mg | ORAL_TABLET | Freq: Two times a day (BID) | ORAL | 0 refills | Status: DC
  Filled 2021-07-21: qty 60, 30d supply, fill #0

## 2021-07-21 NOTE — Progress Notes (Signed)
[place], [place]  [address] Phone:  [phone]   Fax:  [phone] Subjective:   Patient ID: Kevin Hubbard, male    DOB: [birth year], 60 y.o.   MRN: [id number]  Chief Complaint  Patient presents with   Follow-up    Has been in [place] since 2020, needing full check up and medication   HPI Kevin Hubbard 60 y.o. male  has a past medical history of Diabetes mellitus without complication ([place]). To the [facility] for wellness visit and reevaluation of DM2  States that he has been in [place] for the past 2 yrs visiting family. Was not taking medications while in [place]. States that he regularly monitors diet, but denies any daily exercise. 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 Denies checking blood glucose at home. Has had some numbness in the bilateral hands x 6 mths. Denies any recent trauma or injury. Denies any numbness in other extremities. Denies any pain or changes in vision. Last opthalmology visit 1 yr ago. Endorses having one episode of dizziness yesterday, but suspects that its related to drinking Red Bull. Dizziness resolved without intervention. Denies any other complaints today.   Denies any fatigue, chest pain, shortness of breath, HA or dizziness. Denies any blurred vision or tingling.   Past Medical History:  Diagnosis Date   Diabetes mellitus without complication [facility])     Past Surgical History:  Procedure Laterality Date   ABDOMINAL SURGERY      No family history on file.  Social History   Socioeconomic History   Marital status: Single    Spouse name: Not on file   Number of children: Not on file   Years of education: Not on file   Highest education level: Not on file  Occupational History   Not on file  Tobacco Use   Smoking status: Former    Types: Cigarettes    Quit date: 07/17/2012    Years since quitting: 9.0   Smokeless tobacco: Never  Substance and Sexual Activity   Alcohol use: No   Drug  use: No   Sexual activity: Not on file  Other Topics Concern   Not on file  Social History Narrative   Not on file   Social Determinants of Health   Financial Resource Strain: Not on file  Food Insecurity: Not on file  Transportation Needs: Not on file  Physical Activity: Not on file  Stress: Not on file  Social Connections: Not on file  Intimate Partner Violence: Not on file    Outpatient Medications Prior to Visit  Medication Sig Dispense Refill   Multiple Vitamin (MULTIVITAMIN WITH MINERALS) TABS tablet Take 1 tablet by mouth daily.     Blood Glucose Monitoring Suppl (TRUE METRIX AIR GLUCOSE METER) w/Device KIT 1 each by Does not apply route 4 (four) times daily -  with meals and at bedtime. 1 kit 0   glucose blood (TRUE METRIX BLOOD GLUCOSE TEST) test strip Use as instructed 100 each 12   insulin aspart (NOVOLOG FLEXPEN) 100 UNIT/ML FlexPen Inject 6 Units into the skin 3 (three) times daily with meals. 15 mL 11   Insulin Glargine (LANTUS SOLOSTAR) 100 UNIT/ML Solostar Pen Inject 30 Units into the skin at bedtime. 5 pen PRN   Insulin Pen Needle (PEN NEEDLES) 30G X 8 MM MISC 1 each by Does not apply route 4 (four) times daily. 100 each 3   Insulin Syringe-Needle U-100 (ULTICARE INSULIN SYRINGE) 29G X 1/2"  0.3 ML MISC USE AS DIRECTED 4 TIMES DAILY WITH MEALS AND AT BEDTIME 100 each 5   lisinopril (ZESTRIL) 2.5 MG tablet Take 1 tablet (2.5 mg total) by mouth daily. 90 tablet 1   metFORMIN (GLUCOPHAGE) 1000 MG tablet TAKE 1 TABLET BY MOUTH 2 TIMES DAILY WITH A MEAL. 60 tablet 0   cyclobenzaprine (FLEXERIL) 10 MG tablet Take 1 tablet (10 mg total) by mouth 2 (two) times daily as needed for muscle spasms. 20 tablet 0   ibuprofen (ADVIL) 600 MG tablet Take 1 tablet (600 mg total) by mouth every 6 (six) hours as needed for moderate pain. 30 tablet 0   naproxen (NAPROSYN) 500 MG tablet Take 1 tablet (500 mg total) by mouth 2 (two) times daily. 20 tablet 0   No facility-administered  medications prior to visit.    No Known Allergies  Review of Systems  Constitutional:  Negative for chills, fever and malaise/fatigue.  HENT: Negative.    Eyes: Negative.   Respiratory:  Negative for cough and shortness of breath.   Cardiovascular:  Negative for chest pain, palpitations and leg swelling.  Gastrointestinal:  Negative for abdominal pain, blood in stool, constipation, diarrhea, nausea and vomiting.  Genitourinary: Negative.   Musculoskeletal: Negative.   Skin: Negative.   Neurological:  Positive for dizziness and sensory change. Negative for tingling, tremors, speech change, focal weakness, seizures, loss of consciousness, weakness and headaches.  Psychiatric/Behavioral:  Negative for depression. The patient is not nervous/anxious.   All other systems reviewed and are negative.     Objective:    Physical Exam Vitals reviewed.  Constitutional:      General: He is not in acute distress.    Appearance: Normal appearance.  HENT:     Head: Normocephalic.     Right Ear: Tympanic membrane, ear canal and external ear normal.     Left Ear: Tympanic membrane, ear canal and external ear normal.     Nose: Nose normal.     Mouth/Throat:     Mouth: Mucous membranes are moist.     Pharynx: Oropharynx is clear.  Eyes:     Extraocular Movements: Extraocular movements intact.     Conjunctiva/sclera: Conjunctivae normal.     Pupils: Pupils are equal, round, and reactive to light.  Cardiovascular:     Rate and Rhythm: Normal rate and regular rhythm.     Pulses: Normal pulses.     Heart sounds: Normal heart sounds.     Comments: No obvious peripheral edema Pulmonary:     Effort: Pulmonary effort is normal.     Breath sounds: Normal breath sounds.  Abdominal:     General: Abdomen is flat. Bowel sounds are normal.     Palpations: Abdomen is soft.  Musculoskeletal:        General: No swelling, tenderness, deformity or signs of injury. Normal range of motion.     Cervical  back: Normal range of motion and neck supple.     Right lower leg: No edema.     Left lower leg: No edema.  Skin:    General: Skin is warm and dry.     Capillary Refill: Capillary refill takes less than 2 seconds.  Neurological:     General: No focal deficit present.     Mental Status: He is alert and oriented to person, place, and time.     Cranial Nerves: No cranial nerve deficit.     Sensory: No sensory deficit.     Motor: No  weakness.     Coordination: Coordination normal.     Gait: Gait normal.     Deep Tendon Reflexes: Reflexes normal.  Psychiatric:        Mood and Affect: Mood normal.        Behavior: Behavior normal.        Thought Content: Thought content normal.        Judgment: Judgment normal.    BP 127/76    Pulse 67    Temp (!) 97.2 F (36.2 C)    Ht 5' (1.524 m)    Wt 116 lb 0.4 oz (52.6 kg)    SpO2 100%    BMI 22.66 kg/m  Wt Readings from Last 3 Encounters:  07/21/21 116 lb 0.4 oz (52.6 kg)  05/07/19 130 lb (59 kg)  03/31/18 121 lb (54.9 kg)    Immunization History  Administered Date(s) Administered   Influenza,inj,Quad PF,6+ Mos 09/01/2015, 09/29/2016, 03/31/2018   Moderna SARS-COV2 Booster Vaccination 11/26/2020   Moderna Sars-Covid-2 Vaccination 05/27/2020, 06/24/2020, 11/26/2020   Pneumococcal Polysaccharide-23 09/29/2016   Tdap 02/18/2015, 09/01/2015    Diabetic Foot Exam - Simple   No data filed     Lab Results  Component Value Date   TSH 1.810 05/07/2019   Lab Results  Component Value Date   WBC 5.0 05/07/2019   HGB 16.3 05/07/2019   HCT 46.0 05/07/2019   MCV 93 05/07/2019   PLT 174 05/07/2019   Lab Results  Component Value Date   NA 139 05/07/2019   K 4.7 05/07/2019   CO2 23 05/07/2019   GLUCOSE 258 (H) 05/07/2019   BUN 14 05/07/2019   CREATININE 0.97 05/07/2019   BILITOT 1.2 05/07/2019   ALKPHOS 76 05/07/2019   AST 18 05/07/2019   ALT 14 05/07/2019   PROT 6.6 05/07/2019   ALBUMIN 4.6 05/07/2019   CALCIUM 9.8 05/07/2019    ANIONGAP 12 02/14/2018   Lab Results  Component Value Date   CHOL 208 (H) 10/31/2017   CHOL 180 10/27/2016   CHOL 212 (H) 09/01/2015   Lab Results  Component Value Date   HDL 90 10/31/2017   HDL 61 10/27/2016   HDL 75 09/01/2015   Lab Results  Component Value Date   LDLCALC 101 (H) 10/31/2017   LDLCALC 75 10/27/2016   LDLCALC 114 09/01/2015   Lab Results  Component Value Date   TRIG 87 10/31/2017   TRIG 220 (H) 10/27/2016   TRIG 117 09/01/2015   Lab Results  Component Value Date   CHOLHDL 2.3 10/31/2017   CHOLHDL 3.0 10/27/2016   CHOLHDL 2.8 09/01/2015   Lab Results  Component Value Date   HGBA1C 9.1 (A) 07/21/2021   HGBA1C 9.1 07/21/2021   HGBA1C 9.1 (A) 07/21/2021   HGBA1C 9.1 (A) 07/21/2021       Assessment & Plan:   Problem List Items Addressed This Visit       Endocrine   Diabetes mellitus ([place]) - Primary   Relevant Medications   Blood Glucose Monitoring Suppl (TRUE METRIX METER) w/Device KIT   glucose blood (TRUE METRIX BLOOD GLUCOSE TEST) test strip   insulin aspart (NOVOLOG FLEXPEN) 100 UNIT/ML FlexPen   insulin glargine (LANTUS SOLOSTAR) 100 UNIT/ML Solostar Pen   Insulin Syringe-Needle U-100 (TRUEPLUS INSULIN SYRINGE) 31G X 5/16" 0.3 ML MISC   lisinopril (ZESTRIL) 2.5 MG tablet   metFORMIN (GLUCOPHAGE) 1000 MG tablet   Other Relevant Orders   HgB A1c (Completed): 9.1 not at goal, will reevaluate at 3 mth  follow up   Glucose (CBG) (Completed)   POCT URINALYSIS DIP (CLINITEK) (Completed)   CBC with Differential/Platelet   Comprehensive metabolic panel   Lipid panel All medications refilled Discussed the importance of medication compliance Encouraged continued diet and exercise efforts  Encouraged continued compliance with medication  Encouraged patient to schedule ophthalmology appointment  Follow up in 3 mths for reevaluation of DM2, sooner as needed     I have discontinued Kevin Hubbard's naproxen, ibuprofen, and cyclobenzaprine. I have  changed his True Metrix Air Glucose Meter to True Metrix Meter and Pen Needles to Insulin Pen Needle. I have also changed his Insulin Syringe-Needle U-100 and metFORMIN. Additionally, I am having him maintain his multivitamin with minerals, True Metrix Blood Glucose Test, NovoLOG FlexPen, Lantus SoloStar, and lisinopril.  Meds ordered this encounter  Medications   Blood Glucose Monitoring Suppl (TRUE METRIX METER) w/Device KIT    Sig: use as directed 4 (four) times daily -  with meals and at bedtime.    Dispense:  1 kit    Refill:  0   glucose blood (TRUE METRIX BLOOD GLUCOSE TEST) test strip    Sig: Use as instructed    Dispense:  100 each    Refill:  12   insulin aspart (NOVOLOG FLEXPEN) 100 UNIT/ML FlexPen    Sig: Inject 6 Units into the skin 3 (three) times daily with meals.    Dispense:  15 mL    Refill:  11    If blood sugar is < 70...Marland KitchenMarland KitchenHold insulin   insulin glargine (LANTUS SOLOSTAR) 100 UNIT/ML Solostar Pen    Sig: Inject 30 Units into the skin at bedtime.    Dispense:  15 mL    Refill:  2   Insulin Syringe-Needle U-100 (TRUEPLUS INSULIN SYRINGE) 31G X 5/16" 0.3 ML MISC    Sig: USE AS DIRECTED 4 TIMES DAILY WITH MEALS AND AT BEDTIME    Dispense:  100 each    Refill:  5   Insulin Pen Needle 31G X 8 MM MISC    Sig: use to inject insulin 4 (four) times daily.    Dispense:  100 each    Refill:  3   lisinopril (ZESTRIL) 2.5 MG tablet    Sig: Take 1 tablet (2.5 mg total) by mouth daily.    Dispense:  90 tablet    Refill:  1   metFORMIN (GLUCOPHAGE) 1000 MG tablet    Sig: Take 1 tablet (1,000 mg total) by mouth 2 (two) times daily with a meal.    Dispense:  60 tablet    Refill:  0    Must Make appt for refills     Teena Dunk, NP

## 2021-07-21 NOTE — Patient Instructions (Addendum)
You were seen today in the [facility] for reevaluation of chronic illness. Labs were collected, results will be available via MyChart or, if abnormal, you will be contacted by clinic staff. You were prescribed medications, please take as directed. Please follow up in 3 mths for reevaluation of diabetes. Please schedule an eye exam.

## 2021-07-22 ENCOUNTER — Other Ambulatory Visit: Payer: Self-pay

## 2021-07-22 ENCOUNTER — Other Ambulatory Visit: Payer: Self-pay | Admitting: Nurse Practitioner

## 2021-07-22 DIAGNOSIS — E119 Type 2 diabetes mellitus without complications: Secondary | ICD-10-CM

## 2021-07-22 LAB — CBC WITH DIFFERENTIAL/PLATELET
Basophils Absolute: 0 10*3/uL (ref 0.0–0.2)
Basos: 1 %
EOS (ABSOLUTE): 0.1 10*3/uL (ref 0.0–0.4)
Eos: 2 %
Hematocrit: 39.1 % (ref 37.5–51.0)
Hemoglobin: 13.8 g/dL (ref 13.0–17.7)
Immature Grans (Abs): 0 10*3/uL (ref 0.0–0.1)
Immature Granulocytes: 0 %
Lymphocytes Absolute: 1.1 10*3/uL (ref 0.7–3.1)
Lymphs: 24 %
MCH: 32.7 pg (ref 26.6–33.0)
MCHC: 35.3 g/dL (ref 31.5–35.7)
MCV: 93 fL (ref 79–97)
Monocytes Absolute: 0.3 10*3/uL (ref 0.1–0.9)
Monocytes: 7 %
Neutrophils Absolute: 3 10*3/uL (ref 1.4–7.0)
Neutrophils: 66 %
Platelets: 183 10*3/uL (ref 150–450)
RBC: 4.22 x10E6/uL (ref 4.14–5.80)
RDW: 11.1 % — ABNORMAL LOW (ref 11.6–15.4)
WBC: 4.6 10*3/uL (ref 3.4–10.8)

## 2021-07-22 LAB — LIPID PANEL
Chol/HDL Ratio: 2.9 ratio (ref 0.0–5.0)
Cholesterol, Total: 163 mg/dL (ref 100–199)
HDL: 57 mg/dL (ref >39)
LDL Chol Calc (NIH): 91 mg/dL (ref 0–99)
Triglycerides: 81 mg/dL (ref 0–149)
VLDL Cholesterol Cal: 15 mg/dL (ref 5–40)

## 2021-07-22 LAB — COMPREHENSIVE METABOLIC PANEL
ALT: 15 IU/L (ref 0–44)
AST: 16 IU/L (ref 0–40)
Albumin/Globulin Ratio: 2.3 — ABNORMAL HIGH (ref 1.2–2.2)
Albumin: 4.2 g/dL (ref 3.8–4.8)
Alkaline Phosphatase: 87 IU/L (ref 44–121)
BUN/Creatinine Ratio: 14 (ref 10–24)
BUN: 14 mg/dL (ref 8–27)
Bilirubin Total: 1.5 mg/dL — ABNORMAL HIGH (ref 0.0–1.2)
CO2: 26 mmol/L (ref 20–29)
Calcium: 9.3 mg/dL (ref 8.6–10.2)
Chloride: 101 mmol/L (ref 96–106)
Creatinine, Ser: 0.98 mg/dL (ref 0.76–1.27)
Globulin, Total: 1.8 g/dL (ref 1.5–4.5)
Glucose: 267 mg/dL — ABNORMAL HIGH (ref 70–99)
Potassium: 4.4 mmol/L (ref 3.5–5.2)
Sodium: 138 mmol/L (ref 134–144)
Total Protein: 6 g/dL (ref 6.0–8.5)
eGFR: 87 mL/min/{1.73_m2} (ref >59)

## 2021-07-22 MED ORDER — TRUEPLUS LANCETS 28G MISC
12 refills | Status: AC
Start: 2021-07-22 — End: ?
  Filled 2021-07-22 – 2022-07-02 (×2): qty 100, 25d supply, fill #0

## 2021-07-23 ENCOUNTER — Other Ambulatory Visit: Payer: Self-pay

## 2021-07-29 ENCOUNTER — Other Ambulatory Visit: Payer: Self-pay

## 2021-08-26 ENCOUNTER — Other Ambulatory Visit: Payer: Self-pay | Admitting: Nurse Practitioner

## 2021-08-26 ENCOUNTER — Other Ambulatory Visit: Payer: Self-pay

## 2021-08-26 DIAGNOSIS — E1165 Type 2 diabetes mellitus with hyperglycemia: Secondary | ICD-10-CM

## 2021-08-27 ENCOUNTER — Other Ambulatory Visit: Payer: Self-pay

## 2021-08-27 MED ORDER — METFORMIN HCL 1000 MG PO TABS
1000.0000 mg | ORAL_TABLET | Freq: Two times a day (BID) | ORAL | 1 refills | Status: DC
  Filled 2021-08-27: qty 60, 30d supply, fill #0
  Filled 2021-11-10: qty 60, 30d supply, fill #1

## 2021-09-01 ENCOUNTER — Other Ambulatory Visit: Payer: Self-pay

## 2021-09-03 ENCOUNTER — Other Ambulatory Visit: Payer: Self-pay

## 2021-10-19 ENCOUNTER — Other Ambulatory Visit: Payer: Self-pay

## 2021-10-19 ENCOUNTER — Ambulatory Visit (INDEPENDENT_AMBULATORY_CARE_PROVIDER_SITE_OTHER): Payer: Self-pay | Admitting: Nurse Practitioner

## 2021-10-19 ENCOUNTER — Encounter: Payer: Self-pay | Admitting: Nurse Practitioner

## 2021-10-19 ENCOUNTER — Non-Acute Institutional Stay (HOSPITAL_COMMUNITY): Admission: AD | Admit: 2021-10-19 | Payer: Self-pay | Source: Ambulatory Visit | Admitting: Internal Medicine

## 2021-10-19 VITALS — BP 132/75 | HR 64 | Temp 97.6°F | Ht 60.0 in | Wt 121.6 lb

## 2021-10-19 DIAGNOSIS — E119 Type 2 diabetes mellitus without complications: Secondary | ICD-10-CM

## 2021-10-19 LAB — POCT GLYCOSYLATED HEMOGLOBIN (HGB A1C)
HbA1c POC (<> result, manual entry): 8.1 % (ref 4.0–5.6)
HbA1c, POC (controlled diabetic range): 8.1 % — AB (ref 0.0–7.0)
HbA1c, POC (prediabetic range): 8.1 % — AB (ref 5.7–6.4)
Hemoglobin A1C: 8.1 % — AB (ref 4.0–5.6)

## 2021-10-19 MED ORDER — GLIPIZIDE 5 MG PO TABS
5.0000 mg | ORAL_TABLET | Freq: Two times a day (BID) | ORAL | 2 refills | Status: DC
  Filled 2021-10-19 – 2021-11-10 (×2): qty 60, 30d supply, fill #0
  Filled 2022-01-11: qty 60, 30d supply, fill #1
  Filled 2022-07-02: qty 60, 30d supply, fill #2

## 2021-10-19 NOTE — Progress Notes (Signed)
? ?[place] ?[place], [place]  [address] ?Phone:  [phone]   Fax:  [phone] ?Subjective:  ? Patient ID: Kevin Hubbard, male    DOB: 04/18/1961, 61 y.o.   MRN: [id number] ? ?Chief Complaint  ?Patient presents with  ? Follow-up  ?  Pt is here for 3 month follow up visit. No issues or concerns  ? ?HPI ?Kevin Hubbard 61 y.o. male  has a past medical history of Diabetes mellitus without complication ([place]). To the [facility] for follow up.  ? ?Diabetes Mellitus: Patient presents for follow up of diabetes. Symptoms: none. Symptoms have been basically asymptomatic and gradually improved. Patient denies none.  Evaluation to date has been included: hemoglobin A1C.  Home sugars: BGs range between 120 and 150 . Treatment to date: no recent interventions.  Patient has been taking prescribed Lantus, novolog and metformin with no acute changes in dosage. Patient endorses monitoring diet and compliance with all medications. Denies any other concerns today. ? ?Denies any fever. Denies any fatigue, chest pain, shortness of breath, HA or dizziness. Denies any blurred vision, numbness or tingling. ? ? ?Past Medical History:  ?Diagnosis Date  ? Diabetes mellitus without complication ([place])   ? ? ?Past Surgical History:  ?Procedure Laterality Date  ? ABDOMINAL SURGERY    ? ? ?History reviewed. No pertinent family history. ? ?Social History  ? ?Socioeconomic History  ? Marital status: Single  ?  Spouse name: Not on file  ? Number of children: Not on file  ? Years of education: Not on file  ? Highest education level: Not on file  ?Occupational History  ? Not on file  ?Tobacco Use  ? Smoking status: Former  ?  Types: Cigarettes  ?  Quit date: 07/17/2012  ?  Years since quitting: 9.2  ? Smokeless tobacco: Never  ?Substance and Sexual Activity  ? Alcohol use: No  ? Drug use: No  ? Sexual activity: Not on file  ?Other Topics Concern  ? Not on file  ?Social History Narrative  ? Not on file  ? ?Social  Determinants of Health  ? ?Financial Resource Strain: Not on file  ?Food Insecurity: Not on file  ?Transportation Needs: Not on file  ?Physical Activity: Not on file  ?Stress: Not on file  ?Social Connections: Not on file  ?Intimate Partner Violence: Not on file  ? ? ?Outpatient Medications Prior to Visit  ?Medication Sig Dispense Refill  ? Blood Glucose Monitoring Suppl (TRUE METRIX METER) w/Device KIT use as directed 4 (four) times daily -  with meals and at bedtime. 1 kit 0  ? glucose blood (TRUE METRIX BLOOD GLUCOSE TEST) test strip Use as instructed 100 each 12  ? insulin aspart (NOVOLOG FLEXPEN) 100 UNIT/ML FlexPen Inject 6 Units into the skin 3 (three) times daily with meals. 15 mL 11  ? insulin glargine (LANTUS SOLOSTAR) 100 UNIT/ML Solostar Pen Inject 30 Units into the skin at bedtime. 15 mL 2  ? Insulin Pen Needle 31G X 8 MM MISC use to inject insulin 4 (four) times daily. 100 each 3  ? Insulin Syringe-Needle U-100 (TRUEPLUS INSULIN SYRINGE) 31G X 5/16" 0.3 ML MISC USE AS DIRECTED 4 TIMES DAILY WITH MEALS AND AT BEDTIME 100 each 5  ? lisinopril (ZESTRIL) 2.5 MG tablet Take 1 tablet (2.5 mg total) by mouth daily. 90 tablet 1  ? metFORMIN (GLUCOPHAGE) 1000 MG tablet Take 1 tablet (1,000 mg total) by mouth 2 (two) times daily  with a meal. 60 tablet 1  ? Multiple Vitamin (MULTIVITAMIN WITH MINERALS) TABS tablet Take 1 tablet by mouth daily.    ? TRUEplus Lancets 28G MISC Use as instructed 100 each 12  ? ?No facility-administered medications prior to visit.  ? ? ?No Known Allergies ? ?Review of Systems  ?Constitutional: Negative.  Negative for chills, fever and malaise/fatigue.  ?Respiratory:  Negative for cough and shortness of breath.   ?Cardiovascular:  Negative for chest pain, palpitations and leg swelling.  ?Gastrointestinal:  Negative for abdominal pain, blood in stool, constipation, diarrhea, nausea and vomiting.  ?Musculoskeletal: Negative.   ?Skin: Negative.   ?Neurological: Negative.    ?Psychiatric/Behavioral:  Negative for depression. The patient is not nervous/anxious.   ?All other systems reviewed and are negative. ? ?   ?Objective:  ?  ?Physical Exam ?Vitals reviewed.  ?Constitutional:   ?   General: He is not in acute distress. ?   Appearance: Normal appearance. He is normal weight.  ?HENT:  ?   Head: Normocephalic.  ?Cardiovascular:  ?   Rate and Rhythm: Normal rate and regular rhythm.  ?   Pulses: Normal pulses.  ?   Heart sounds: Normal heart sounds.  ?   Comments: No obvious peripheral edema ?Pulmonary:  ?   Effort: Pulmonary effort is normal.  ?   Breath sounds: Normal breath sounds.  ?Skin: ?   General: Skin is warm and dry.  ?   Capillary Refill: Capillary refill takes less than 2 seconds.  ?Neurological:  ?   General: No focal deficit present.  ?   Mental Status: He is alert and oriented to person, place, and time.  ?Psychiatric:     ?   Mood and Affect: Mood normal.     ?   Behavior: Behavior normal.     ?   Thought Content: Thought content normal.     ?   Judgment: Judgment normal.  ? ? ?BP 132/75   Pulse 64   Temp 97.6 ?F (36.4 ?C)   Ht 5' (1.524 m)   Wt 121 lb 9.6 oz (55.2 kg)   SpO2 100%   BMI 23.75 kg/m?  ?Wt Readings from Last 3 Encounters:  ?10/19/21 121 lb 9.6 oz (55.2 kg)  ?07/21/21 116 lb 0.4 oz (52.6 kg)  ?05/07/19 130 lb (59 kg)  ? ? ?Immunization History  ?Administered Date(s) Administered  ? Influenza,inj,Quad PF,6+ Mos 09/01/2015, 09/29/2016, 03/31/2018  ? Moderna SARS-COV2 Booster Vaccination 11/26/2020  ? Moderna Sars-Covid-2 Vaccination 05/27/2020, 06/24/2020, 11/26/2020  ? Pneumococcal Polysaccharide-23 09/29/2016  ? Tdap 02/18/2015, 09/01/2015  ? ? ?Diabetic Foot Exam - Simple   ?No data filed ?  ? ? ?Lab Results  ?Component Value Date  ? TSH 1.810 05/07/2019  ? ?Lab Results  ?Component Value Date  ? WBC 4.6 07/21/2021  ? HGB 13.8 07/21/2021  ? HCT 39.1 07/21/2021  ? MCV 93 07/21/2021  ? PLT 183 07/21/2021  ? ?Lab Results  ?Component Value Date  ? NA 138  07/21/2021  ? K 4.4 07/21/2021  ? CO2 26 07/21/2021  ? GLUCOSE 267 (H) 07/21/2021  ? BUN 14 07/21/2021  ? CREATININE 0.98 07/21/2021  ? BILITOT 1.5 (H) 07/21/2021  ? ALKPHOS 87 07/21/2021  ? AST 16 07/21/2021  ? ALT 15 07/21/2021  ? PROT 6.0 07/21/2021  ? ALBUMIN 4.2 07/21/2021  ? CALCIUM 9.3 07/21/2021  ? ANIONGAP 12 02/14/2018  ? EGFR 87 07/21/2021  ? ?Lab Results  ?Component Value Date  ?  CHOL 163 07/21/2021  ? CHOL 208 (H) 10/31/2017  ? CHOL 180 10/27/2016  ? ?Lab Results  ?Component Value Date  ? HDL 57 07/21/2021  ? HDL 90 10/31/2017  ? HDL 61 10/27/2016  ? ?Lab Results  ?Component Value Date  ? [place] 91 07/21/2021  ? LDLCALC 101 (H) 10/31/2017  ? [place] 75 10/27/2016  ? ?Lab Results  ?Component Value Date  ? TRIG 81 07/21/2021  ? TRIG 87 10/31/2017  ? TRIG 220 (H) 10/27/2016  ? ?Lab Results  ?Component Value Date  ? CHOLHDL 2.9 07/21/2021  ? CHOLHDL 2.3 10/31/2017  ? CHOLHDL 3.0 10/27/2016  ? ?Lab Results  ?Component Value Date  ? HGBA1C 9.1 (A) 07/21/2021  ? HGBA1C 9.1 07/21/2021  ? HGBA1C 9.1 (A) 07/21/2021  ? HGBA1C 9.1 (A) 07/21/2021  ? ? ?   ?Assessment & Plan:  ? ?Problem List Items Addressed This Visit   ? ?  ? Endocrine  ? Diabetes mellitus ([place]) - Primary  ? Relevant Medications  ? glipiZIDE (GLUCOTROL) 5 MG tablet, added during today's visit  ? Other Relevant Orders  ? POCT glycosylated hemoglobin (Hb A1C): 8.1 ?Encouraged continued diet and exercise efforts  ?Encouraged continued compliance with medication  ? ?Follow up in 3 mths for reevaluation of DM, sooner as needed   ? ? ?I am having Amada Jupiter start on glipiZIDE. I am also having him maintain his multivitamin with minerals, True Metrix Meter, True Metrix Blood Glucose Test, NovoLOG FlexPen, Lantus SoloStar, Insulin Syringe-Needle U-100, Insulin Pen Needle, lisinopril, TRUEplus Lancets 28G, and metFORMIN. ? ?Meds ordered this encounter  ?Medications  ? glipiZIDE (GLUCOTROL) 5 MG tablet  ?  Sig: Take 1 tablet (5 mg total) by mouth 2 (two)  times daily before a meal.  ?  Dispense:  60 tablet  ?  Refill:  2  ? ? ? ?Teena Dunk, NP ?  ?

## 2021-10-19 NOTE — Patient Instructions (Signed)
You were seen today in the [facility] for reevaluation of DM. Labs were collected, results will be available via MyChart or, if abnormal, you will be contacted by clinic staff. You were prescribed medications, please take as directed. Please follow up in 3 mths for reevaluation of diabetes. ?

## 2021-10-26 ENCOUNTER — Other Ambulatory Visit: Payer: Self-pay

## 2021-11-10 ENCOUNTER — Other Ambulatory Visit: Payer: Self-pay

## 2022-01-11 ENCOUNTER — Other Ambulatory Visit: Payer: Self-pay | Admitting: Nurse Practitioner

## 2022-01-11 ENCOUNTER — Other Ambulatory Visit: Payer: Self-pay

## 2022-01-11 DIAGNOSIS — Z794 Long term (current) use of insulin: Secondary | ICD-10-CM

## 2022-01-11 DIAGNOSIS — E119 Type 2 diabetes mellitus without complications: Secondary | ICD-10-CM

## 2022-01-11 MED ORDER — NOVOLOG FLEXPEN 100 UNIT/ML ~~LOC~~ SOPN
6.0000 [IU] | PEN_INJECTOR | Freq: Three times a day (TID) | SUBCUTANEOUS | 11 refills | Status: DC
  Filled 2022-01-11: qty 15, 83d supply, fill #0

## 2022-01-11 MED ORDER — METFORMIN HCL 1000 MG PO TABS
1000.0000 mg | ORAL_TABLET | Freq: Two times a day (BID) | ORAL | 1 refills | Status: DC
  Filled 2022-01-11: qty 60, 30d supply, fill #0
  Filled 2022-07-02: qty 60, 30d supply, fill #1

## 2022-01-11 MED ORDER — LANTUS SOLOSTAR 100 UNIT/ML ~~LOC~~ SOPN
30.0000 [IU] | PEN_INJECTOR | Freq: Every day | SUBCUTANEOUS | 2 refills | Status: DC
  Filled 2022-01-11: qty 9, 30d supply, fill #0
  Filled 2022-07-02: qty 9, 30d supply, fill #1
  Filled 2022-09-07: qty 9, 30d supply, fill #2
  Filled 2022-10-06: qty 9, 30d supply, fill #3
  Filled 2022-12-14: qty 9, 30d supply, fill #4

## 2022-01-13 ENCOUNTER — Other Ambulatory Visit: Payer: Self-pay

## 2022-01-19 ENCOUNTER — Ambulatory Visit (INDEPENDENT_AMBULATORY_CARE_PROVIDER_SITE_OTHER): Payer: Self-pay | Admitting: Nurse Practitioner

## 2022-01-19 ENCOUNTER — Encounter: Payer: Self-pay | Admitting: Nurse Practitioner

## 2022-01-19 ENCOUNTER — Other Ambulatory Visit: Payer: Self-pay

## 2022-01-19 VITALS — BP 141/69 | HR 67 | Temp 97.8°F | Ht 60.0 in | Wt 123.0 lb

## 2022-01-19 DIAGNOSIS — E119 Type 2 diabetes mellitus without complications: Secondary | ICD-10-CM

## 2022-01-19 DIAGNOSIS — I10 Essential (primary) hypertension: Secondary | ICD-10-CM

## 2022-01-19 DIAGNOSIS — Z794 Long term (current) use of insulin: Secondary | ICD-10-CM

## 2022-01-19 DIAGNOSIS — E1165 Type 2 diabetes mellitus with hyperglycemia: Secondary | ICD-10-CM

## 2022-01-19 LAB — POCT GLYCOSYLATED HEMOGLOBIN (HGB A1C)
HbA1c POC (<> result, manual entry): 6.4 % (ref 4.0–5.6)
HbA1c, POC (controlled diabetic range): 6.4 % (ref 0.0–7.0)
HbA1c, POC (prediabetic range): 6.4 % (ref 5.7–6.4)
Hemoglobin A1C: 6.4 % — AB (ref 4.0–5.6)

## 2022-01-19 MED ORDER — LISINOPRIL 10 MG PO TABS
10.0000 mg | ORAL_TABLET | Freq: Every day | ORAL | 0 refills | Status: DC
  Filled 2022-01-19 – 2022-10-06 (×2): qty 90, 90d supply, fill #0

## 2022-01-19 NOTE — Patient Instructions (Signed)
You were seen today in the [facility] for reevaluation of chronic illness. Labs were collected, results will be available via MyChart or, if abnormal, you will be contacted by clinic staff. You were prescribed medications, please take as directed. Please follow up in 3 mths for reevaluation of chronic illness  

## 2022-01-19 NOTE — Progress Notes (Signed)
[place], [place]  [address] Phone:  [phone]   Fax:  [phone] Subjective:   Patient ID: Kevin Hubbard, male    DOB: 1960-11-10, 61 y.o.   MRN: [id number]  Chief Complaint  Patient presents with   Follow-up    Pt is here for 3 months DM follow up visit. Pt stated some times he feels dizzy   HPI Kevin Hubbard 61 y.o. male  has a past medical history of Diabetes mellitus without complication ([place]). To the [facility] for reevaluation of chronic   Patient states that he has had intermittent dizziness x 2-3 wks. States that it feels like he is spinning. States that dizziness occurs mostly with activity.  Hypertension: Patient here for follow-up of elevated blood pressure. He is exercising and is adherent to low salt diet.  Patient does not check b/p at home. Cardiac symptoms none. Patient denies chest pain, dyspnea, fatigue, and near-syncope.  Cardiovascular risk factors: advanced age (older than 38 for men, 22 for women) and male gender. Use of agents associated with hypertension: none. History of target organ damage: none. States that he has not taken his B/P medications today.   Diabetes Mellitus: Patient presents for follow up of diabetes. Symptoms: none. Denies any symptoms. Patient denies foot ulcerations, hypoglycemia , and paresthesia of the feet.  Evaluation to date has been included: hemoglobin A1C.  Home sugars: BGs have been labile ranging between 140 and 160 . Treatment to date: no recent interventions.   Denies any other concerns today. Denies any fatigue, chest pain, shortness of breath or HA. Denies any blurred vision, numbness or tingling.  Past Medical History:  Diagnosis Date   Diabetes mellitus without complication ([place])     Past Surgical History:  Procedure Laterality Date   ABDOMINAL SURGERY      History reviewed. No pertinent family history.  Social History   Socioeconomic History   Marital status: Single     Spouse name: Not on file   Number of children: Not on file   Years of education: Not on file   Highest education level: Not on file  Occupational History   Not on file  Tobacco Use   Smoking status: Former    Types: Cigarettes    Quit date: 07/17/2012    Years since quitting: 9.5   Smokeless tobacco: Never  Substance and Sexual Activity   Alcohol use: No   Drug use: No   Sexual activity: Not on file  Other Topics Concern   Not on file  Social History Narrative   Not on file   Social Determinants of Health   Financial Resource Strain: Not on file  Food Insecurity: Not on file  Transportation Needs: Not on file  Physical Activity: Not on file  Stress: Not on file  Social Connections: Not on file  Intimate Partner Violence: Not on file    Outpatient Medications Prior to Visit  Medication Sig Dispense Refill   Blood Glucose Monitoring Suppl (TRUE METRIX METER) w/Device KIT use as directed 4 (four) times daily -  with meals and at bedtime. 1 kit 0   glipiZIDE (GLUCOTROL) 5 MG tablet Take 1 tablet (5 mg total) by mouth 2 (two) times daily before a meal. 60 tablet 2   glucose blood (TRUE METRIX BLOOD GLUCOSE TEST) test strip Use as instructed 100 each 12   insulin aspart (NOVOLOG FLEXPEN) 100 UNIT/ML FlexPen Inject 6 Units into the skin 3 (three) times  daily with meals. 15 mL 11   insulin glargine (LANTUS SOLOSTAR) 100 UNIT/ML Solostar Pen Inject 30 Units into the skin at bedtime. 15 mL 2   Insulin Pen Needle 31G X 8 MM MISC use to inject insulin 4 (four) times daily. 100 each 3   Insulin Syringe-Needle U-100 (TRUEPLUS INSULIN SYRINGE) 31G X 5/16" 0.3 ML MISC USE AS DIRECTED 4 TIMES DAILY WITH MEALS AND AT BEDTIME 100 each 5   metFORMIN (GLUCOPHAGE) 1000 MG tablet Take 1 tablet (1,000 mg total) by mouth 2 (two) times daily with a meal. 60 tablet 1   Multiple Vitamin (MULTIVITAMIN WITH MINERALS) TABS tablet Take 1 tablet by mouth daily.     TRUEplus Lancets 28G MISC Use as  instructed 100 each 12   lisinopril (ZESTRIL) 2.5 MG tablet Take 1 tablet (2.5 mg total) by mouth daily. 90 tablet 1   No facility-administered medications prior to visit.    No Known Allergies  Review of Systems  Constitutional:  Negative for chills, fever and malaise/fatigue.  Eyes: Negative.   Respiratory:  Negative for cough and shortness of breath.   Cardiovascular:  Negative for chest pain, palpitations and leg swelling.  Gastrointestinal:  Negative for abdominal pain, blood in stool, constipation, diarrhea, nausea and vomiting.  Musculoskeletal: Negative.   Skin: Negative.   Neurological:  Positive for dizziness. Negative for tingling, tremors, sensory change, speech change, focal weakness, seizures, loss of consciousness, weakness and headaches.  Psychiatric/Behavioral:  Negative for depression. The patient is not nervous/anxious.   All other systems reviewed and are negative.      Objective:    Physical Exam Vitals reviewed.  Constitutional:      General: He is not in acute distress.    Appearance: Normal appearance. He is normal weight.  HENT:     Head: Normocephalic.     Right Ear: Tympanic membrane, ear canal and external ear normal. There is no impacted cerumen.     Left Ear: Tympanic membrane, ear canal and external ear normal. There is no impacted cerumen.     Nose: Nose normal. No congestion or rhinorrhea.     Mouth/Throat:     Mouth: Mucous membranes are moist.     Pharynx: Oropharynx is clear. No oropharyngeal exudate or posterior oropharyngeal erythema.  Eyes:     General: No scleral icterus.       Right eye: No discharge.        Left eye: No discharge.     Extraocular Movements: Extraocular movements intact.     Conjunctiva/sclera: Conjunctivae normal.     Pupils: Pupils are equal, round, and reactive to light.  Neck:     Vascular: No carotid bruit.  Cardiovascular:     Rate and Rhythm: Normal rate and regular rhythm.     Pulses: Normal pulses.      Heart sounds: Normal heart sounds.     Comments: No obvious peripheral edema Pulmonary:     Effort: Pulmonary effort is normal.     Breath sounds: Normal breath sounds.  Musculoskeletal:     Cervical back: Normal range of motion and neck supple. No rigidity or tenderness.  Lymphadenopathy:     Cervical: No cervical adenopathy.  Skin:    General: Skin is warm and dry.     Capillary Refill: Capillary refill takes less than 2 seconds.  Neurological:     General: No focal deficit present.     Mental Status: He is alert and oriented to person, place, and  time.  Psychiatric:        Mood and Affect: Mood normal.        Behavior: Behavior normal.        Thought Content: Thought content normal.        Judgment: Judgment normal.     BP (!) 141/69 (BP Location: Left Arm, Patient Position: Sitting, Cuff Size: Normal)   Pulse 67   Temp 97.8 F (36.6 C)   Ht 5' (1.524 m)   Wt 123 lb (55.8 kg)   SpO2 100%   BMI 24.02 kg/m  Wt Readings from Last 3 Encounters:  01/19/22 123 lb (55.8 kg)  10/19/21 121 lb 9.6 oz (55.2 kg)  07/21/21 116 lb 0.4 oz (52.6 kg)    Immunization History  Administered Date(s) Administered   Influenza,inj,Quad PF,6+ Mos 09/01/2015, 09/29/2016, 03/31/2018   Moderna SARS-COV2 Booster Vaccination 11/26/2020   Moderna Sars-Covid-2 Vaccination 05/27/2020, 06/24/2020, 11/26/2020   Pneumococcal Polysaccharide-23 09/29/2016   Tdap 02/18/2015, 09/01/2015    Diabetic Foot Exam - Simple   No data filed     Lab Results  Component Value Date   TSH 1.810 05/07/2019   Lab Results  Component Value Date   WBC 4.6 07/21/2021   HGB 13.8 07/21/2021   HCT 39.1 07/21/2021   MCV 93 07/21/2021   PLT 183 07/21/2021   Lab Results  Component Value Date   NA 138 07/21/2021   K 4.4 07/21/2021   CO2 26 07/21/2021   GLUCOSE 267 (H) 07/21/2021   BUN 14 07/21/2021   CREATININE 0.98 07/21/2021   BILITOT 1.5 (H) 07/21/2021   ALKPHOS 87 07/21/2021   AST 16 07/21/2021   ALT  15 07/21/2021   PROT 6.0 07/21/2021   ALBUMIN 4.2 07/21/2021   CALCIUM 9.3 07/21/2021   ANIONGAP 12 02/14/2018   EGFR 87 07/21/2021   Lab Results  Component Value Date   CHOL 163 07/21/2021   CHOL 208 (H) 10/31/2017   CHOL 180 10/27/2016   Lab Results  Component Value Date   HDL 57 07/21/2021   HDL 90 10/31/2017   HDL 61 10/27/2016   Lab Results  Component Value Date   LDLCALC 91 07/21/2021   LDLCALC 101 (H) 10/31/2017   LDLCALC 75 10/27/2016   Lab Results  Component Value Date   TRIG 81 07/21/2021   TRIG 87 10/31/2017   TRIG 220 (H) 10/27/2016   Lab Results  Component Value Date   CHOLHDL 2.9 07/21/2021   CHOLHDL 2.3 10/31/2017   CHOLHDL 3.0 10/27/2016   Lab Results  Component Value Date   HGBA1C 6.4 (A) 01/19/2022   HGBA1C 6.4 01/19/2022   HGBA1C 6.4 01/19/2022   HGBA1C 6.4 01/19/2022       Assessment & Plan:   Problem List Items Addressed This Visit       Endocrine   Diabetes mellitus ([place]) - Primary   Relevant Medications   lisinopril (ZESTRIL) 10 MG tablet   Other Relevant Orders   CBC with Differential/Platelet   Comprehensive metabolic panel   Lipid panel   POCT glycosylated hemoglobin (Hb A1C) (Completed):6.4, improved since last visit and at goal   CBC with Differential/Platelet   Comprehensive metabolic panel   Lipid panel Encouraged continued diet and exercise efforts  Encouraged continued compliance with medication     Other Visit Diagnoses     Primary hypertension       Relevant Medications   lisinopril (ZESTRIL) 10 MG tablet, dosage increased   Other Relevant Orders  CBC with Differential/Platelet   Comprehensive metabolic panel   Lipid panel Encouraged to check B/P at home regularly  Encouraged continued diet and exercise efforts  Encouraged continued compliance with medication     Follow up in 3 mths for reevaluation of chronic illness and dizziness, sooner as needed     I have changed Cory Roughen Weick's lisinopril. I  am also having him maintain his multivitamin with minerals, True Metrix Meter, True Metrix Blood Glucose Test, Insulin Syringe-Needle U-100, Insulin Pen Needle, TRUEplus Lancets 28G, glipiZIDE, metFORMIN, NovoLOG FlexPen, and Lantus SoloStar.  Meds ordered this encounter  Medications   lisinopril (ZESTRIL) 10 MG tablet    Sig: Take 1 tablet (10 mg total) by mouth daily.    Dispense:  90 tablet    Refill:  0     Teena Dunk, NP

## 2022-01-20 LAB — CBC WITH DIFFERENTIAL/PLATELET
Basophils Absolute: 0.1 10*3/uL (ref 0.0–0.2)
Basos: 1 %
EOS (ABSOLUTE): 0.1 10*3/uL (ref 0.0–0.4)
Eos: 2 %
Hematocrit: 38.1 % (ref 37.5–51.0)
Hemoglobin: 13.8 g/dL (ref 13.0–17.7)
Immature Grans (Abs): 0 10*3/uL (ref 0.0–0.1)
Immature Granulocytes: 0 %
Lymphocytes Absolute: 1.2 10*3/uL (ref 0.7–3.1)
Lymphs: 20 %
MCH: 33.1 pg — ABNORMAL HIGH (ref 26.6–33.0)
MCHC: 36.2 g/dL — ABNORMAL HIGH (ref 31.5–35.7)
MCV: 91 fL (ref 79–97)
Monocytes Absolute: 0.4 10*3/uL (ref 0.1–0.9)
Monocytes: 6 %
Neutrophils Absolute: 4.2 10*3/uL (ref 1.4–7.0)
Neutrophils: 71 %
Platelets: 179 10*3/uL (ref 150–450)
RBC: 4.17 x10E6/uL (ref 4.14–5.80)
RDW: 12 % (ref 11.6–15.4)
WBC: 6 10*3/uL (ref 3.4–10.8)

## 2022-01-20 LAB — LIPID PANEL
Chol/HDL Ratio: 2.1 ratio (ref 0.0–5.0)
Cholesterol, Total: 169 mg/dL (ref 100–199)
HDL: 81 mg/dL (ref >39)
LDL Chol Calc (NIH): 75 mg/dL (ref 0–99)
Triglycerides: 66 mg/dL (ref 0–149)
VLDL Cholesterol Cal: 13 mg/dL (ref 5–40)

## 2022-01-20 LAB — COMPREHENSIVE METABOLIC PANEL
ALT: 11 IU/L (ref 0–44)
AST: 17 IU/L (ref 0–40)
Albumin/Globulin Ratio: 2 (ref 1.2–2.2)
Albumin: 4.1 g/dL (ref 3.8–4.8)
Alkaline Phosphatase: 59 IU/L (ref 44–121)
BUN/Creatinine Ratio: 9 — ABNORMAL LOW (ref 10–24)
BUN: 8 mg/dL (ref 8–27)
Bilirubin Total: 0.9 mg/dL (ref 0.0–1.2)
CO2: 23 mmol/L (ref 20–29)
Calcium: 9.1 mg/dL (ref 8.6–10.2)
Chloride: 111 mmol/L — ABNORMAL HIGH (ref 96–106)
Creatinine, Ser: 0.87 mg/dL (ref 0.76–1.27)
Globulin, Total: 2.1 g/dL (ref 1.5–4.5)
Glucose: 149 mg/dL — ABNORMAL HIGH (ref 70–99)
Potassium: 4.7 mmol/L (ref 3.5–5.2)
Sodium: 145 mmol/L — ABNORMAL HIGH (ref 134–144)
Total Protein: 6.2 g/dL (ref 6.0–8.5)
eGFR: 97 mL/min/{1.73_m2} (ref >59)

## 2022-01-25 ENCOUNTER — Other Ambulatory Visit: Payer: Self-pay

## 2022-04-22 ENCOUNTER — Ambulatory Visit: Payer: Self-pay | Admitting: Nurse Practitioner

## 2022-07-02 ENCOUNTER — Other Ambulatory Visit: Payer: Self-pay

## 2022-08-03 ENCOUNTER — Other Ambulatory Visit: Payer: Self-pay

## 2022-08-03 ENCOUNTER — Other Ambulatory Visit: Payer: Self-pay | Admitting: Nurse Practitioner

## 2022-08-03 DIAGNOSIS — E1165 Type 2 diabetes mellitus with hyperglycemia: Secondary | ICD-10-CM

## 2022-08-04 ENCOUNTER — Other Ambulatory Visit: Payer: Self-pay

## 2022-08-04 MED ORDER — METFORMIN HCL 1000 MG PO TABS
1000.0000 mg | ORAL_TABLET | Freq: Two times a day (BID) | ORAL | 1 refills | Status: DC
  Filled 2022-08-04: qty 60, 30d supply, fill #0
  Filled 2022-09-07: qty 60, 30d supply, fill #1

## 2022-09-07 ENCOUNTER — Other Ambulatory Visit: Payer: Self-pay

## 2022-10-06 ENCOUNTER — Other Ambulatory Visit: Payer: Self-pay | Admitting: Nurse Practitioner

## 2022-10-06 ENCOUNTER — Other Ambulatory Visit: Payer: Self-pay

## 2022-10-06 DIAGNOSIS — E1165 Type 2 diabetes mellitus with hyperglycemia: Secondary | ICD-10-CM

## 2022-10-06 MED ORDER — METFORMIN HCL 1000 MG PO TABS
1000.0000 mg | ORAL_TABLET | Freq: Two times a day (BID) | ORAL | 0 refills | Status: DC
  Filled 2022-10-06: qty 60, 30d supply, fill #0

## 2022-10-12 ENCOUNTER — Other Ambulatory Visit: Payer: Self-pay

## 2022-10-14 ENCOUNTER — Other Ambulatory Visit: Payer: Self-pay

## 2022-11-01 ENCOUNTER — Other Ambulatory Visit: Payer: Self-pay

## 2022-11-03 ENCOUNTER — Encounter: Payer: Self-pay | Admitting: Nurse Practitioner

## 2022-11-03 ENCOUNTER — Ambulatory Visit (INDEPENDENT_AMBULATORY_CARE_PROVIDER_SITE_OTHER): Payer: Self-pay | Admitting: Nurse Practitioner

## 2022-11-03 ENCOUNTER — Other Ambulatory Visit: Payer: Self-pay

## 2022-11-03 VITALS — BP 107/56 | HR 65 | Temp 97.5°F | Wt 120.6 lb

## 2022-11-03 DIAGNOSIS — E1165 Type 2 diabetes mellitus with hyperglycemia: Secondary | ICD-10-CM

## 2022-11-03 DIAGNOSIS — Z794 Long term (current) use of insulin: Secondary | ICD-10-CM

## 2022-11-03 LAB — POCT GLYCOSYLATED HEMOGLOBIN (HGB A1C): Hemoglobin A1C: 6.6 % — AB (ref 4.0–5.6)

## 2022-11-03 MED ORDER — METFORMIN HCL ER 500 MG PO TB24
500.0000 mg | ORAL_TABLET | Freq: Every day | ORAL | 2 refills | Status: DC
  Filled 2022-11-03: qty 30, 30d supply, fill #0
  Filled 2022-12-14: qty 30, 30d supply, fill #1
  Filled 2023-01-05: qty 30, 30d supply, fill #2

## 2022-11-03 MED ORDER — TRUE METRIX BLOOD GLUCOSE TEST VI STRP
ORAL_STRIP | 12 refills | Status: DC
  Filled 2022-11-03: qty 100, 25d supply, fill #0
  Filled 2023-03-16: qty 100, 25d supply, fill #1

## 2022-11-03 NOTE — Patient Instructions (Signed)
1. Type 2 diabetes mellitus with hyperglycemia, with long-term current use of insulin  - Microalbumin/Creatinine Ratio, Urine - POCT glycosylated hemoglobin (Hb A1C) - Ambulatory referral to Podiatry - metFORMIN (GLUCOPHAGE-XR) 500 MG 24 hr tablet; Take 1 tablet (500 mg total) by mouth daily with breakfast.  Dispense: 30 tablet; Refill: 2 - CBC - Comprehensive metabolic panel - glucose blood (TRUE METRIX BLOOD GLUCOSE TEST) test strip; Use as instructed  Dispense: 100 each; Refill: 12   Follow up:  Follow up in 3 months

## 2022-11-03 NOTE — Assessment & Plan Note (Signed)
-   Microalbumin/Creatinine Ratio, Urine - POCT glycosylated hemoglobin (Hb A1C) - Ambulatory referral to Podiatry - metFORMIN (GLUCOPHAGE-XR) 500 MG 24 hr tablet; Take 1 tablet (500 mg total) by mouth daily with breakfast.  Dispense: 30 tablet; Refill: 2 - CBC - Comprehensive metabolic panel - glucose blood (TRUE METRIX BLOOD GLUCOSE TEST) test strip; Use as instructed  Dispense: 100 each; Refill: 12   Follow up:  Follow up in 3 months

## 2022-11-03 NOTE — Progress Notes (Signed)
@Patient  ID: Kevin Hubbard, male    DOB: 08-02-1961, 62 y.o.   MRN: [id number]  Chief Complaint  Patient presents with   Diabetes    Follow up    Referring provider: No ref. provider found  HPI  Kevin Hubbard 62 y.o. male  has a past medical history of Diabetes mellitus without complication ([place]). To the [facility] for reevaluation of chronic      Hypertension: Patient here for follow-up of elevated blood pressure. He has been lost to follow up. He did go back to [place] and has recently returned. He is exercising and is adherent to low salt diet.  Patient does not check b/p at home. Cardiac symptoms none. Patient denies chest pain, dyspnea, fatigue, and near-syncope.  Cardiovascular risk factors: advanced age (older than 25 for men, 66 for women) and male gender. Use of agents associated with hypertension: none. History of target organ damage: none. States that he has not taken his B/P medications today.    Diabetes Mellitus: Patient presents for follow up of diabetes. Symptoms: none. Denies any symptoms. Evaluation to date has been included: hemoglobin A1C.  Home sugars: BGs have been labile ranging between 140-180. Treatment to date: no recent interventions.   Complains today of numbness to bilateral feet. This has been an issue for a few months.    Denies any other concerns today. Denies any fatigue, chest pain, shortness of breath or HA. Denies any blurred vision, numbness or tingling.     No Known Allergies  Immunization History  Administered Date(s) Administered   Influenza,inj,Quad PF,6+ Mos 09/01/2015, 09/29/2016, 03/31/2018   Moderna SARS-COV2 Booster Vaccination 11/26/2020   Moderna Sars-Covid-2 Vaccination 05/27/2020, 06/24/2020   Pneumococcal Polysaccharide-23 09/29/2016   Tdap 02/18/2015, 09/01/2015    Past Medical History:  Diagnosis Date   Diabetes mellitus without complication     Tobacco History: Social History   Tobacco Use  Smoking Status Former   Types:  Cigarettes   Quit date: 07/17/2012   Years since quitting: 10.3  Smokeless Tobacco Never   Counseling given: Not Answered   Outpatient Encounter Medications as of 11/03/2022  Medication Sig   Blood Glucose Monitoring Suppl (TRUE METRIX METER) w/Device KIT use as directed 4 (four) times daily -  with meals and at bedtime.   insulin glargine (LANTUS SOLOSTAR) 100 UNIT/ML Solostar Pen Inject 30 Units into the skin at bedtime.   Insulin Pen Needle 31G X 8 MM MISC use to inject insulin 4 (four) times daily.   Insulin Syringe-Needle U-100 (TRUEPLUS INSULIN SYRINGE) 31G X 5/16" 0.3 ML MISC USE AS DIRECTED 4 TIMES DAILY WITH MEALS AND AT BEDTIME   metFORMIN (GLUCOPHAGE-XR) 500 MG 24 hr tablet Take 1 tablet (500 mg total) by mouth daily with breakfast.   TRUEplus Lancets 28G MISC Use as instructed   [DISCONTINUED] glipiZIDE (GLUCOTROL) 5 MG tablet Take 1 tablet (5 mg total) by mouth 2 (two) times daily before a meal.   [DISCONTINUED] glucose blood (TRUE METRIX BLOOD GLUCOSE TEST) test strip Use as instructed   [DISCONTINUED] metFORMIN (GLUCOPHAGE) 1000 MG tablet Take 1 tablet (1,000 mg total) by mouth 2 (two) times daily with a meal (Must Make appt for refills)   glucose blood (TRUE METRIX BLOOD GLUCOSE TEST) test strip Use as instructed   insulin aspart (NOVOLOG FLEXPEN) 100 UNIT/ML FlexPen Inject 6 Units into the skin 3 (three) times daily with meals. (Patient not taking: Reported on 11/03/2022)   lisinopril (ZESTRIL) 10 MG tablet Take 1 tablet (10 mg  total) by mouth daily. (Patient not taking: Reported on 11/03/2022)   Multiple Vitamin (MULTIVITAMIN WITH MINERALS) TABS tablet Take 1 tablet by mouth daily. (Patient not taking: Reported on 11/03/2022)   No facility-administered encounter medications on file as of 11/03/2022.     Review of Systems  Review of Systems  Constitutional: Negative.   HENT: Negative.    Cardiovascular: Negative.   Gastrointestinal: Negative.   Musculoskeletal:         Numbness to feet  Allergic/Immunologic: Negative.   Neurological: Negative.   Psychiatric/Behavioral: Negative.         Physical Exam  BP (!) 107/56   Pulse 65   Temp (!) 97.5 F (36.4 C)   Wt 120 lb 9.6 oz (54.7 kg)   SpO2 100%   BMI 23.55 kg/m   Wt Readings from Last 5 Encounters:  11/03/22 120 lb 9.6 oz (54.7 kg)  01/19/22 123 lb (55.8 kg)  10/19/21 121 lb 9.6 oz (55.2 kg)  07/21/21 116 lb 0.4 oz (52.6 kg)  05/07/19 130 lb (59 kg)     Physical Exam Vitals and nursing note reviewed.  Constitutional:      General: He is not in acute distress.    Appearance: He is well-developed.  Cardiovascular:     Rate and Rhythm: Normal rate and regular rhythm.  Pulmonary:     Effort: Pulmonary effort is normal.     Breath sounds: Normal breath sounds.  Skin:    General: Skin is warm and dry.  Neurological:     Mental Status: He is alert and oriented to person, place, and time.      Lab Results:  CBC    Component Value Date/Time   WBC 6.0 01/19/2022 1018   WBC 10.1 02/14/2018 1424   RBC 4.17 01/19/2022 1018   RBC 5.41 02/14/2018 1424   HGB 13.8 01/19/2022 1018   HCT 38.1 01/19/2022 1018   PLT 179 01/19/2022 1018   MCV 91 01/19/2022 1018   MCH 33.1 (H) 01/19/2022 1018   MCH 31.2 02/14/2018 1424   MCHC 36.2 (H) 01/19/2022 1018   MCHC 35.6 02/14/2018 1424   RDW 12.0 01/19/2022 1018   LYMPHSABS 1.2 01/19/2022 1018   MONOABS 0.4 12/22/2017 2047   EOSABS 0.1 01/19/2022 1018   BASOSABS 0.1 01/19/2022 1018    BMET    Component Value Date/Time   NA 145 (H) 01/19/2022 1018   K 4.7 01/19/2022 1018   CL 111 (H) 01/19/2022 1018   CO2 23 01/19/2022 1018   GLUCOSE 149 (H) 01/19/2022 1018   GLUCOSE 148 (H) 02/14/2018 1424   BUN 8 01/19/2022 1018   CREATININE 0.87 01/19/2022 1018   CREATININE 0.87 09/01/2015 1004   CALCIUM 9.1 01/19/2022 1018   GFRNONAA 84 05/07/2019 1046   GFRNONAA >89 09/01/2015 1004   GFRAA 98 05/07/2019 1046   GFRAA >89 09/01/2015 1004        Assessment & Plan:   Diabetes mellitus ([facility]) - Microalbumin/Creatinine Ratio, Urine - POCT glycosylated hemoglobin (Hb A1C) - Ambulatory referral to Podiatry - metFORMIN (GLUCOPHAGE-XR) 500 MG 24 hr tablet; Take 1 tablet (500 mg total) by mouth daily with breakfast.  Dispense: 30 tablet; Refill: 2 - CBC - Comprehensive metabolic panel - glucose blood (TRUE METRIX BLOOD GLUCOSE TEST) test strip; Use as instructed  Dispense: 100 each; Refill: 12   Follow up:  Follow up in 3 months     Fenton Foy, NP 11/03/2022

## 2022-11-04 ENCOUNTER — Other Ambulatory Visit: Payer: Self-pay

## 2022-11-04 LAB — COMPREHENSIVE METABOLIC PANEL
ALT: 21 IU/L (ref 0–44)
AST: 18 IU/L (ref 0–40)
Albumin/Globulin Ratio: 2.2 (ref 1.2–2.2)
Albumin: 4.2 g/dL (ref 3.9–4.9)
Alkaline Phosphatase: 75 IU/L (ref 44–121)
BUN/Creatinine Ratio: 16 (ref 10–24)
BUN: 19 mg/dL (ref 8–27)
Bilirubin Total: 1 mg/dL (ref 0.0–1.2)
CO2: 21 mmol/L (ref 20–29)
Calcium: 9.4 mg/dL (ref 8.6–10.2)
Chloride: 104 mmol/L (ref 96–106)
Creatinine, Ser: 1.18 mg/dL (ref 0.76–1.27)
Globulin, Total: 1.9 g/dL (ref 1.5–4.5)
Glucose: 258 mg/dL — ABNORMAL HIGH (ref 70–99)
Potassium: 4.7 mmol/L (ref 3.5–5.2)
Sodium: 138 mmol/L (ref 134–144)
Total Protein: 6.1 g/dL (ref 6.0–8.5)
eGFR: 69 mL/min/{1.73_m2} (ref >59)

## 2022-11-04 LAB — MICROALBUMIN / CREATININE URINE RATIO
Creatinine, Urine: 154.6 mg/dL
Microalb/Creat Ratio: 177 mg/g creat — ABNORMAL HIGH (ref 0–29)
Microalbumin, Urine: 274 ug/mL

## 2022-11-04 LAB — CBC
Hematocrit: 39.7 % (ref 37.5–51.0)
Hemoglobin: 14 g/dL (ref 13.0–17.7)
MCH: 31.2 pg (ref 26.6–33.0)
MCHC: 35.3 g/dL (ref 31.5–35.7)
MCV: 88 fL (ref 79–97)
Platelets: 178 10*3/uL (ref 150–450)
RBC: 4.49 x10E6/uL (ref 4.14–5.80)
RDW: 11.5 % — ABNORMAL LOW (ref 11.6–15.4)
WBC: 5.1 10*3/uL (ref 3.4–10.8)

## 2022-11-05 NOTE — Progress Notes (Signed)
Called pt and inform results.Gh 

## 2022-11-10 ENCOUNTER — Ambulatory Visit (INDEPENDENT_AMBULATORY_CARE_PROVIDER_SITE_OTHER): Payer: Self-pay | Admitting: Podiatry

## 2022-11-10 VITALS — BP 145/76 | HR 67

## 2022-11-10 DIAGNOSIS — M2012 Hallux valgus (acquired), left foot: Secondary | ICD-10-CM

## 2022-11-10 DIAGNOSIS — B351 Tinea unguium: Secondary | ICD-10-CM

## 2022-11-10 DIAGNOSIS — M2011 Hallux valgus (acquired), right foot: Secondary | ICD-10-CM

## 2022-11-10 DIAGNOSIS — M79675 Pain in left toe(s): Secondary | ICD-10-CM

## 2022-11-10 DIAGNOSIS — E119 Type 2 diabetes mellitus without complications: Secondary | ICD-10-CM

## 2022-11-10 DIAGNOSIS — E1142 Type 2 diabetes mellitus with diabetic polyneuropathy: Secondary | ICD-10-CM

## 2022-11-10 DIAGNOSIS — L84 Corns and callosities: Secondary | ICD-10-CM

## 2022-11-10 DIAGNOSIS — M79674 Pain in right toe(s): Secondary | ICD-10-CM

## 2022-11-10 NOTE — Patient Instructions (Signed)
Moisturize with [employer] Ultimate Diabetic Foot Lotion. Available at [facility], [place], [facility].  La diabetes mellitus y el cuidado de los pies Diabetes Mellitus and Foot Care La diabetes, tambin llamada diabetes mellitus, puede causar problemas en los pies y las piernas debido a un flujo sanguneo (circulacin) deficiente. La mala circulacin puede hacer que la piel: Se torne ms fina y seca. Se resquebraje ms fcilmente. Cicatrice ms lentamente. Se descame y agriete. Tambin puede presentar tener dao nervioso (neuropata). Esto puede causar una disminucin de la sensibilidad en las piernas y los pies. En consecuencia, es posible que no advierta heridas pequeas en los pies que pueden causar problemas ms graves. Identificar problemas y tratarlos lo antes posible es la mejor manera de evitar futuros problemas en los pies. Cmo cuidar los pies Higiene de los pies  [employer] pies todos los [address]  [address] con agua tibia y un [place]. No use agua caliente. Luego squese los pies y [employer] dedos dando palmaditas hasta que estn completamente secos. No ponga los pies en remojo. Esto puede secarle la piel. Crtese las uas de los pies en lnea recta. No escarbe debajo de las uas o alrededor [employer]. Lime los bordes de las uas con una lima o esmeril. Aplique una locin hidratante o vaselina en la piel de los pies y en las uas secas y [place]. Use una locin que no contenga alcohol ni fragancias. No aplique locin entre los dedos. Zapatos y calcetines Use calcetines de algodn o medias limpias todos los [place]. Asegrese de que no le [employer] No use medias hasta la rodilla. Estas pueden reducir el flujo de sangre a las piernas. Use zapatos que le queden bien y que sean acolchados. Revise siempre los zapatos antes de ponrselos para asegurarse de que no haya objetos en su interior. Para amoldar los zapatos, clcelos solo algunas horas por da. Esto evitar lesiones en los  pies. Heridas, rasguos, durezas y callosidades  Controle sus pies diariamente para observar si hay ampollas, cortes, moretones, llagas o enrojecimiento. Si no puede ver la planta del pie, use un espejo o pdale ayuda a Engineer, maintenance (IT). No corte las durezas o callosidades, ni trate de quitarlas con medicamentos. Si algo le ha raspado, cortado o lastimado la piel de los pies, mantenga la piel de esa zona limpia y [place]. Puede higienizar estas zonas con agua y un jabn suave. No limpie la zona con agua oxigenada, alcohol ni yodo. Si tiene una herida, un rasguo, una dureza o una callosidad en el pie, revsela varias veces al da para asegurarse de que se est curando y no se infecte. Est atento a los siguientes signos: Enrojecimiento, Optician, dispensing. Lquido o sangre. Calor. Pus o mal olor. Consejos generales No se cruce de piernas. Esto puede disminuir el flujo de sangre a los pies. No use bolsas de agua caliente ni almohadillas trmicas en los pies. Podran causar quemaduras. Si ha perdido la sensibilidad en los pies o las piernas, no sabr lo que le est sucediendo hasta que sea demasiado tarde. Proteja sus pies del calor y del fro con calzado, en la playa o sobre el pavimento caliente. Programe una cita para un examen completo de los pies por lo menos una vez al ao o con ms frecuencia si tiene [employer]. Informe todos los cortes, llagas o moretones a su mdico de inmediato. Dnde obtener ms informacin American Diabetes Association (Asociacin Estadounidense de la Diabetes): diabetes.org Association of Diabetes Care & Education Specialists (  Asociacin de Especialistas en Atencin y [employer] la Diabetes): diabeteseducator.org Comunquese con un mdico si: Tiene una afeccin que aumenta su riesgo de tener infecciones y tiene cortes, llagas o moretones en los pies. Tiene una lesin que no se [place]. Tiene una zona irritada en las piernas o los pies. Siente una sensacin de  ardor u hormigueo en las piernas o los pies. Siente dolor o calambres en las piernas o los pies. Las piernas o los pies estn adormecidos. Siente los pies siempre fros. Siente dolor alrededor de [place] ua del pie. Solicite ayuda de inmediato si: Tiene una herida, un rasguo, una dureza o una callosidad en el pie y: Foye Deer signos de infeccin. Tiene fiebre. Tiene una lnea roja que sube por la pierna. Esta informacin no tiene Theme park manager el consejo del mdico. Asegrese de hacerle al mdico cualquier pregunta que tenga. Document Revised: 02/09/2022 Document Reviewed: 02/09/2022 Elsevier Patient Education  2023 [employer].

## 2022-11-14 ENCOUNTER — Encounter: Payer: Self-pay | Admitting: Podiatry

## 2022-11-14 NOTE — Progress Notes (Signed)
ANNUAL DIABETIC FOOT EXAM  Subjective: Cas Tracz presents today for annual diabetic foot examination.  Chief Complaint  Patient presents with   Diabetes    [facility] BS - 183 A1C - DK  [facility] - 2WKS AGO  DIABETIC EXAM     Patient confirms h/o diabetes since 1995.  Patient denies any h/o foot wounds.   Patient endorses symptoms of foot tingling.  Risk factors: diabetes, h/o tobacco use in remission.  Ivonne Andrew, NP is patient's PCP.  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 1 kit 0   glucose blood (TRUE METRIX BLOOD GLUCOSE TEST) test strip Use as instructed 100 each 12   insulin aspart (NOVOLOG FLEXPEN) 100 UNIT/ML FlexPen Inject 6 Units into the skin 3 (three) times daily with meals. (Patient not taking: Reported on 11/03/2022) 15 mL 11   insulin glargine (LANTUS SOLOSTAR) 100 UNIT/ML Solostar Pen Inject 30 Units into the skin at bedtime. 15 mL 2   Insulin Pen Needle 31G X 8 MM MISC use to inject insulin 4 (four) times daily. 100 each 3   Insulin Syringe-Needle U-100 (TRUEPLUS INSULIN SYRINGE) 31G X 5/16" 0.3 ML MISC USE AS DIRECTED 4 TIMES DAILY WITH MEALS AND AT BEDTIME 100 each 5   lisinopril (ZESTRIL) 10 MG tablet Take 1 tablet (10 mg total) by mouth daily. (Patient not taking: Reported on 11/03/2022) 90 tablet 0   metFORMIN (GLUCOPHAGE-XR) 500 MG 24 hr tablet Take 1 tablet (500 mg  total) by mouth daily with breakfast. 30 tablet 2   Multiple Vitamin (MULTIVITAMIN WITH MINERALS) TABS tablet Take 1 tablet by mouth daily. (Patient not taking: Reported on 11/03/2022)     TRUEplus Lancets 28G MISC Use as instructed 100 each 12   No current facility-administered medications on file prior to visit.    No Known Allergies Social History   Occupational History   Not on file  Tobacco Use   Smoking status: Former    Types: Cigarettes    Quit date: 07/17/2012    Years since quitting: 10.3   Smokeless tobacco: Never  Substance and Sexual Activity   Alcohol use: No   Drug use: No   Sexual activity: Not on file   No family history on file. Immunization History  Administered Date(s) Administered   Influenza,inj,Quad PF,6+ Mos 09/01/2015, 09/29/2016, 03/31/2018   Moderna SARS-COV2 Booster Vaccination 11/26/2020   Moderna Sars-Covid-2 Vaccination 05/27/2020, 06/24/2020   Pneumococcal Polysaccharide-23 09/29/2016   Tdap 02/18/2015, 09/01/2015     Review of Systems: Negative except as noted in the HPI.   Objective: Vitals:   11/10/22 1521  BP: (!) 145/76  Pulse: 67  SpO2: 99%    Kevin Hubbard is a pleasant 62 y.o. male in NAD. AAO X 3.  Vascular Examination: Capillary refill time immediate b/l. Vascular status intact b/l with palpable pedal pulses. Pedal hair present b/l. No edema. No pain  with calf compression b/l. Skin temperature gradient WNL b/l. No ischemia or gangrene noted b/l LE. No cyanosis or clubbing noted b/l LE.  Neurological Examination: Sensation grossly intact b/l with 10 gram monofilament. Vibratory sensation intact b/l. Pt has subjective symptoms of neuropathy.  Dermatological Examination: Pedal skin with normal turgor, texture and tone b/l.  No open wounds. No interdigital macerations.   Toenails 1-5 b/l thick, discolored, elongated with subungual debris and pain on dorsal palpation.   Hyperkeratotic lesion(s) submet head 1 right foot, submet  head 2 left foot, and submet head 5 left foot.  No erythema, no edema, no drainage, no fluctuance.  Musculoskeletal Examination: Normal muscle strength 5/5 to all lower extremity muscle groups bilaterally. HAV with bunion deformity noted b/l LE.Marland Kitchen No pain, crepitus or joint limitation noted with ROM b/l LE.  Patient ambulates independently without assistive aids.  Radiographs: None  Last A1c:      Latest Ref Rng & Units 11/03/2022    1:43 PM 01/19/2022    9:21 AM  Hemoglobin A1C  Hemoglobin-A1c 4.0 - 5.6 % 6.6  6.4    6.4    6.4    6.4      Footwear Assessment: Does the patient wear appropriate shoes? Yes. Does the patient need inserts/orthotics? No.  Lab Results  Component Value Date   HGBA1C 6.6 (A) 11/03/2022   ADA Risk Categorization: Low Risk :  Patient has all of the following: Intact protective sensation No prior foot ulcer  No severe deformity Pedal pulses present  Assessment: 1. Pain due to onychomycosis of toenails of both feet   2. Callus   3. Hallux valgus, acquired, bilateral   4. Diabetic peripheral neuropathy associated with type 2 diabetes mellitus   5. Encounter for diabetic foot exam     Plan: -Patient was evaluated and treated. All patient's and/or POA's questions/concerns answered on today's visit. -Diabetic foot examination performed today. -Patient/POA educated on dangers of using sharp instrumentation on toes/feet. Recommended continued professional foot care in presence of diabetes. Patient/POA relates understanding. -Patient to continue soft, supportive shoe gear daily. -Toenails 1-5 b/l were debrided in length and girth with sterile nail nippers and dremel without iatrogenic bleeding.  -Callus(es) submet head 1 right foot, submet head 2 left foot, and submet head 5 left foot pared utilizing sharp debridement with sterile blade without complication or incident. Total number debrided =3. -For dry skin, patient was given written list of OTC  moisturizers. Patient/POA instructed to apply to foot/feet once daily avoiding application between toes.  -Patient/POA to call should there be question/concern in the interim. Return in about 3 months (around 02/09/2023).  Freddie Breech, DPM

## 2022-12-01 IMAGING — CR DG WRIST COMPLETE 3+V*L*
4 series · 4 of 4 positions shown · non-contrast
Comparison: None.

CLINICAL DATA: Fall 4 days prior, left wrist and first metacarpal
pain and swelling

EXAM:
LEFT WRIST - COMPLETE 3+ VIEW

[wrist pa]
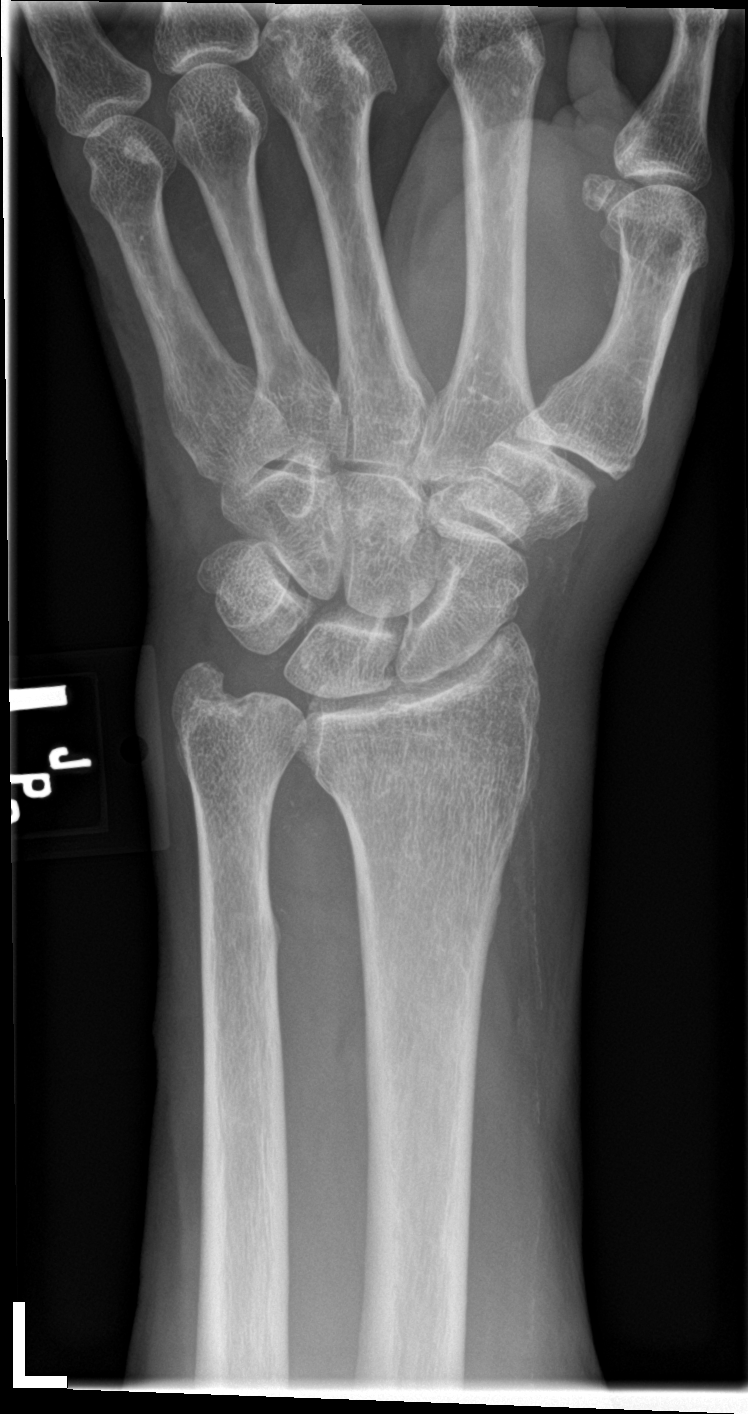

[wrist obl]
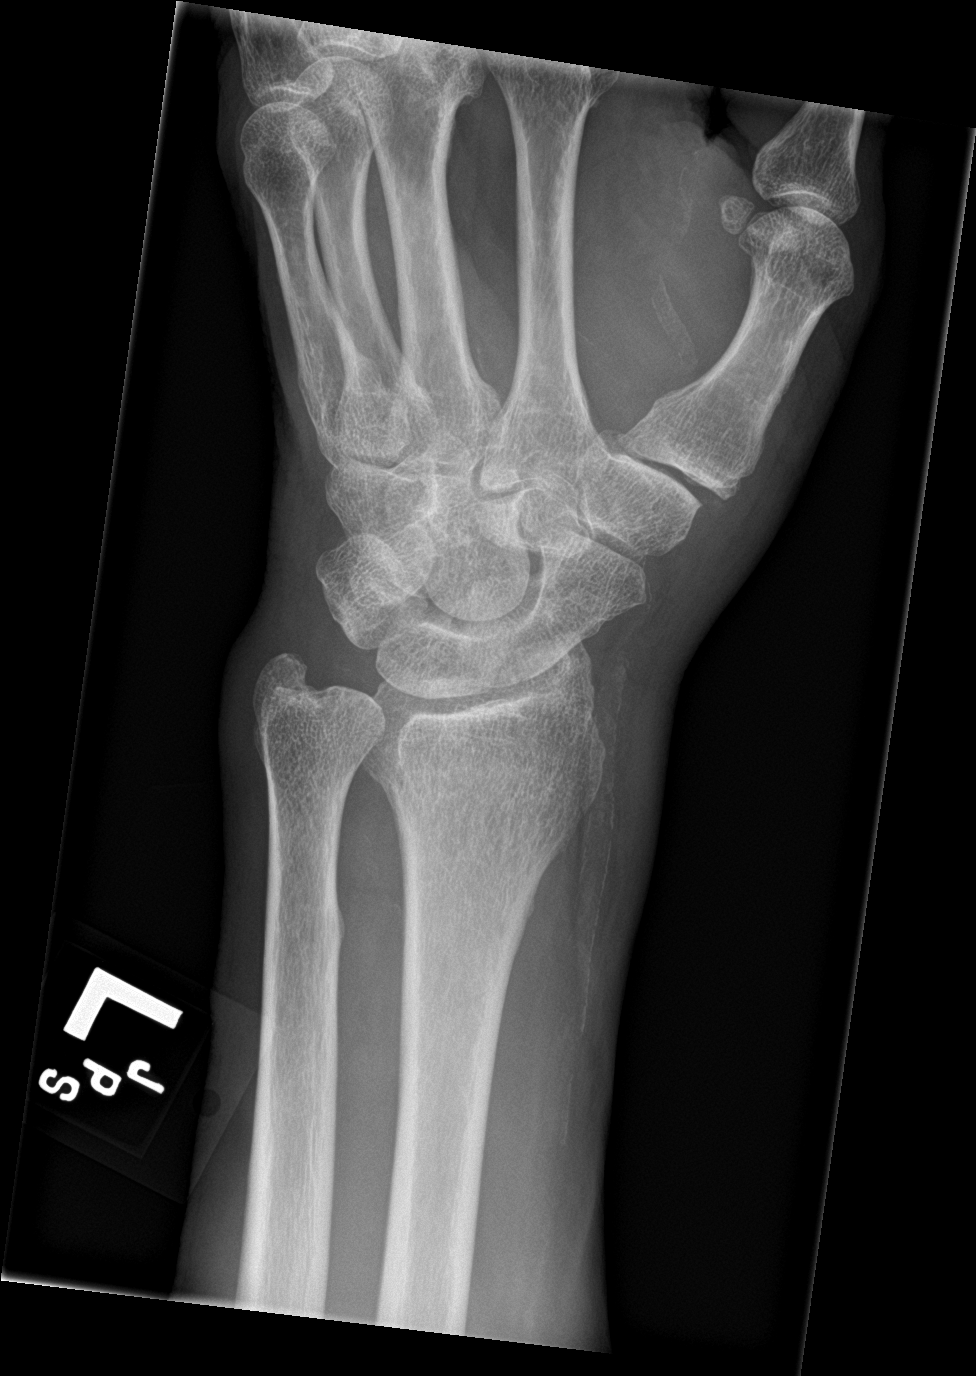

[wrist lat]
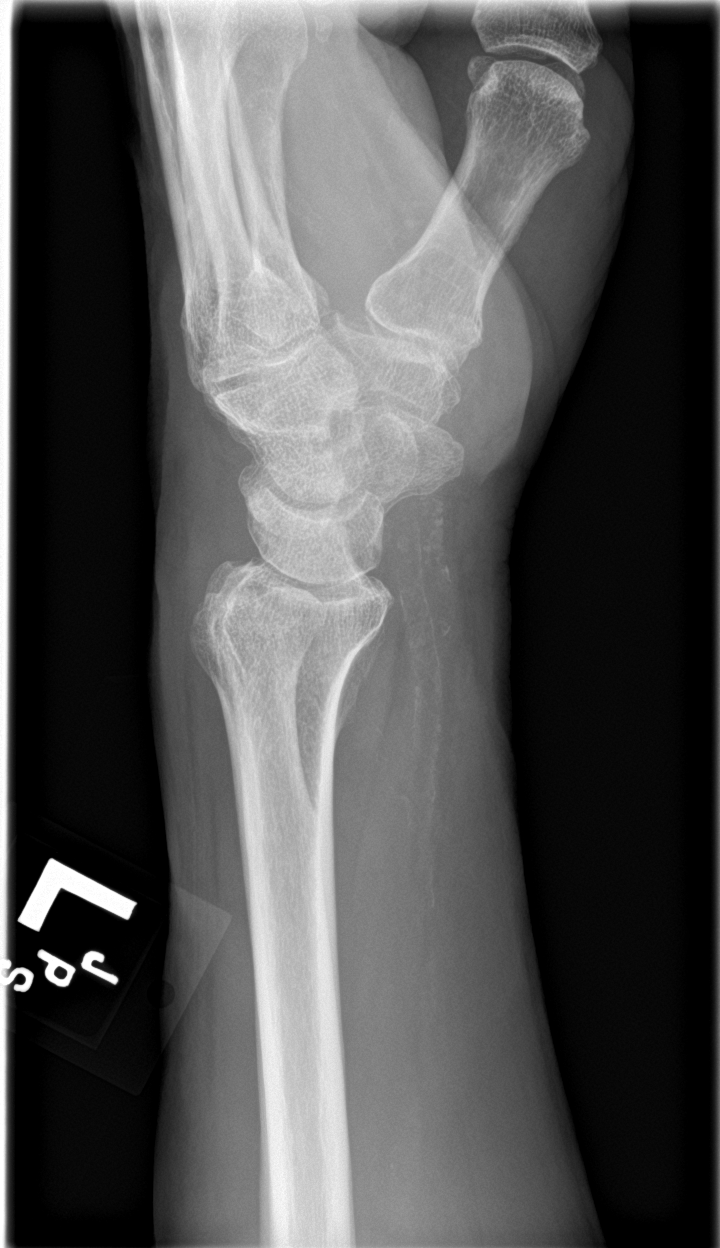

[wrist navicular]
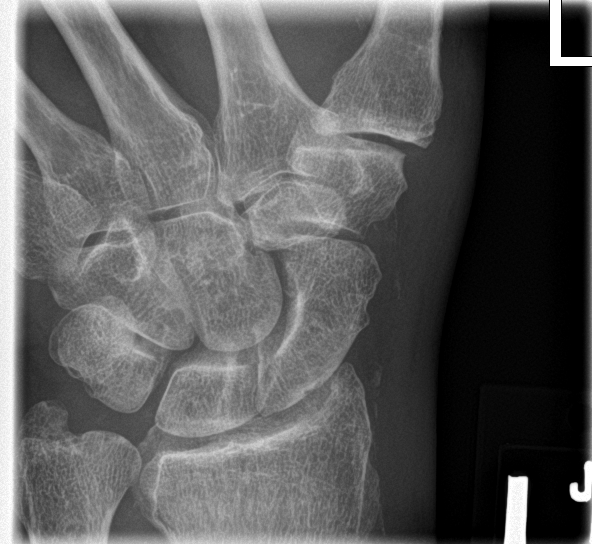

[4 of 4 positions shown; findings below may reference images not displayed]

FINDINGS: No fracture or dislocation. Mild first carpometacarpal joint
osteoarthritis. No suspicious focal osseous lesions. Vascular
calcifications in the soft tissues. No radiopaque foreign bodies.
IMPRESSION: No fracture or dislocation. Mild first carpometacarpal joint
osteoarthritis.

## 2022-12-14 ENCOUNTER — Other Ambulatory Visit: Payer: Self-pay

## 2023-01-04 ENCOUNTER — Other Ambulatory Visit: Payer: Self-pay

## 2023-01-04 ENCOUNTER — Other Ambulatory Visit: Payer: Self-pay | Admitting: Nurse Practitioner

## 2023-01-04 DIAGNOSIS — E119 Type 2 diabetes mellitus without complications: Secondary | ICD-10-CM

## 2023-01-05 ENCOUNTER — Other Ambulatory Visit: Payer: Self-pay

## 2023-02-04 ENCOUNTER — Ambulatory Visit (INDEPENDENT_AMBULATORY_CARE_PROVIDER_SITE_OTHER): Payer: Self-pay | Admitting: Nurse Practitioner

## 2023-02-04 ENCOUNTER — Encounter: Payer: Self-pay | Admitting: Nurse Practitioner

## 2023-02-04 ENCOUNTER — Other Ambulatory Visit: Payer: Self-pay

## 2023-02-04 VITALS — BP 135/69 | HR 57 | Ht 64.0 in | Wt 114.0 lb

## 2023-02-04 DIAGNOSIS — E1165 Type 2 diabetes mellitus with hyperglycemia: Secondary | ICD-10-CM

## 2023-02-04 DIAGNOSIS — Z1211 Encounter for screening for malignant neoplasm of colon: Secondary | ICD-10-CM

## 2023-02-04 DIAGNOSIS — R1110 Vomiting, unspecified: Secondary | ICD-10-CM

## 2023-02-04 DIAGNOSIS — R634 Abnormal weight loss: Secondary | ICD-10-CM

## 2023-02-04 DIAGNOSIS — Z13220 Encounter for screening for lipoid disorders: Secondary | ICD-10-CM

## 2023-02-04 DIAGNOSIS — Z794 Long term (current) use of insulin: Secondary | ICD-10-CM

## 2023-02-04 LAB — POCT URINALYSIS DIPSTICK
Appearance: NEGATIVE
Bilirubin, UA: NEGATIVE
Blood, UA: NEGATIVE
Glucose, UA: NEGATIVE
Ketones, UA: NEGATIVE
Leukocytes, UA: NEGATIVE
Nitrite, UA: NEGATIVE
Protein, UA: NEGATIVE
Spec Grav, UA: 1.015 (ref 1.010–1.025)
Urobilinogen, UA: NEGATIVE E.U./dL — AB
pH, UA: 7 (ref 5.0–8.0)

## 2023-02-04 LAB — POCT GLYCOSYLATED HEMOGLOBIN (HGB A1C): Hemoglobin A1C: 6.2 % — AB (ref 4.0–5.6)

## 2023-02-04 MED ORDER — METFORMIN HCL ER 500 MG PO TB24
500.0000 mg | ORAL_TABLET | Freq: Two times a day (BID) | ORAL | 2 refills | Status: DC
  Filled 2023-02-04: qty 60, 30d supply, fill #0
  Filled 2023-03-16: qty 60, 30d supply, fill #1

## 2023-02-04 MED ORDER — MIRTAZAPINE 30 MG PO TABS
30.0000 mg | ORAL_TABLET | Freq: Every day | ORAL | 2 refills | Status: DC
  Filled 2023-02-04: qty 30, 30d supply, fill #0

## 2023-02-04 NOTE — Assessment & Plan Note (Signed)
-   POCT glycosylated hemoglobin (Hb A1C) - metFORMIN (GLUCOPHAGE-XR) 500 MG 24 hr tablet; Take 1 tablet (500 mg total) by mouth 2 (two) times daily with a meal.  Dispense: 60 tablet; Refill: 2 - CBC - Comprehensive metabolic panel - Microalbumin/Creatinine Ratio, Urine  2. Weight loss  - mirtazapine (REMERON) 30 MG tablet; Take 1 tablet (30 mg total) by mouth at bedtime.  Dispense: 30 tablet; Refill: 2 - CBC - Comprehensive metabolic panel - Ambulatory referral to Gastroenterology  3. Lipid screening  - Lipid Panel  4. Colon cancer screening  - Ambulatory referral to Gastroenterology  5. Vomiting, unspecified vomiting type, unspecified whether nausea present  - Ambulatory referral to Gastroenterology  -may need endoscopy  Follow up:  Follow up in 3 months

## 2023-02-04 NOTE — Progress Notes (Signed)
@Patient  ID: Kevin Hubbard, male    DOB: 05-Mar-1961, 62 y.o.   MRN: [id number]  Chief Complaint  Patient presents with   Follow-up    Pt stated metformin change to 1000 mg.    Referring provider: Ivonne Andrew, NP   HPI  Kevin Hubbard 62 y.o. male  has a past medical history of Diabetes mellitus without complication (HCC). To the [facility] for reevaluation of chronic      Hypertension: Patient here for follow-up of elevated blood pressure. He has been lost to follow up. He did go back to [place] and has recently returned. He is exercising and is adherent to low salt diet.  Patient does not check b/p at home. Cardiac symptoms none. Patient denies chest pain, dyspnea, fatigue, and near-syncope.  Cardiovascular risk factors: advanced age (older than 52 for men, 53 for women) and male gender. Use of agents associated with hypertension: none. History of target organ damage: none. States that he has not taken his B/P medications today.    Diabetes Mellitus: Patient presents for follow up of diabetes. Symptoms: none. Denies any symptoms. Evaluation to date has been included: hemoglobin A1C.  Home sugars: BGs have been labile ranging between 130-150. Treatment to date: no recent interventions. Patient states that he is no longer taking insulin. He states that his blood sugars were dropping too low. Will trial metformin 500 mg am and pm. Currently only taking once daily. A1C today is 6.2.  Reports weight loss and loss of appetite. Does have episodes of vomiting, but thinks this is related to heat. States that bowel and bladder are normal. Has never had colon cancer screen. Will refer to GI.    Denies any other concerns today. Denies any fatigue, chest pain, shortness of breath or HA. Denies any blurred vision, numbness or tingling.    No Known Allergies  Immunization History  Administered Date(s) Administered   Influenza,inj,Quad PF,6+ Mos 09/01/2015, 09/29/2016, 03/31/2018   Moderna SARS-COV2  Booster Vaccination 11/26/2020   Moderna Sars-Covid-2 Vaccination 05/27/2020, 06/24/2020   Pneumococcal Polysaccharide-23 09/29/2016   Tdap 02/18/2015, 09/01/2015    Past Medical History:  Diagnosis Date   Diabetes mellitus without complication (HCC)     Tobacco History: Social History   Tobacco Use  Smoking Status Former   Types: Cigarettes   Quit date: 07/17/2012   Years since quitting: 10.5  Smokeless Tobacco Never   Counseling given: Not Answered   Outpatient Encounter Medications as of 02/04/2023  Medication Sig   Blood Glucose Monitoring Suppl (TRUE METRIX METER) w/Device KIT use as directed 4 (four) times daily -  with meals and at bedtime.   glucose blood (TRUE METRIX BLOOD GLUCOSE TEST) test strip Use as instructed   insulin aspart (NOVOLOG FLEXPEN) 100 UNIT/ML FlexPen Inject 6 Units into the skin 3 (three) times daily with meals.   insulin glargine (LANTUS SOLOSTAR) 100 UNIT/ML Solostar Pen Inject 30 Units into the skin at bedtime.   Insulin Pen Needle 31G X 8 MM MISC use to inject insulin 4 (four) times daily.   Insulin Syringe-Needle U-100 (TRUEPLUS INSULIN SYRINGE) 31G X 5/16" 0.3 ML MISC USE AS DIRECTED 4 TIMES DAILY WITH MEALS AND AT BEDTIME   lisinopril (ZESTRIL) 10 MG tablet Take 1 tablet (10 mg total) by mouth daily.   mirtazapine (REMERON) 30 MG tablet Take 1 tablet (30 mg total) by mouth at bedtime.   Multiple Vitamin (MULTIVITAMIN WITH MINERALS) TABS tablet Take 1 tablet by mouth daily.   TRUEplus Lancets  28G MISC Use as instructed   [DISCONTINUED] metFORMIN (GLUCOPHAGE-XR) 500 MG 24 hr tablet Take 1 tablet (500 mg total) by mouth daily with breakfast.   metFORMIN (GLUCOPHAGE-XR) 500 MG 24 hr tablet Take 1 tablet (500 mg total) by mouth 2 (two) times daily with a meal.   No facility-administered encounter medications on file as of 02/04/2023.     Review of Systems  Review of Systems  Constitutional:  Positive for appetite change and unexpected weight  change.  HENT: Negative.    Cardiovascular: Negative.   Gastrointestinal: Negative.   Allergic/Immunologic: Negative.   Neurological: Negative.   Psychiatric/Behavioral: Negative.         Physical Exam  BP 135/69 (BP Location: Right Arm, Patient Position: Sitting, Cuff Size: Normal)   Pulse (!) 57   Ht 5\' 4"  (1.626 m)   Wt 114 lb (51.7 kg)   SpO2 96%   BMI 19.57 kg/m   Wt Readings from Last 5 Encounters:  02/04/23 114 lb (51.7 kg)  11/03/22 120 lb 9.6 oz (54.7 kg)  01/19/22 123 lb (55.8 kg)  10/19/21 121 lb 9.6 oz (55.2 kg)  07/21/21 116 lb 0.4 oz (52.6 kg)     Physical Exam Vitals and nursing note reviewed.  Constitutional:      General: He is not in acute distress.    Appearance: He is well-developed.  Cardiovascular:     Rate and Rhythm: Normal rate and regular rhythm.  Pulmonary:     Effort: Pulmonary effort is normal.     Breath sounds: Normal breath sounds.  Skin:    General: Skin is warm and dry.  Neurological:     Mental Status: He is alert and oriented to person, place, and time.      Lab Results:  CBC    Component Value Date/Time   WBC 5.1 11/03/2022 1355   WBC 10.1 02/14/2018 1424   RBC 4.49 11/03/2022 1355   RBC 5.41 02/14/2018 1424   HGB 14.0 11/03/2022 1355   HCT 39.7 11/03/2022 1355   PLT 178 11/03/2022 1355   MCV 88 11/03/2022 1355   MCH 31.2 11/03/2022 1355   MCH 31.2 02/14/2018 1424   MCHC 35.3 11/03/2022 1355   MCHC 35.6 02/14/2018 1424   RDW 11.5 (L) 11/03/2022 1355   LYMPHSABS 1.2 01/19/2022 1018   MONOABS 0.4 12/22/2017 2047   EOSABS 0.1 01/19/2022 1018   BASOSABS 0.1 01/19/2022 1018    BMET    Component Value Date/Time   NA 138 11/03/2022 1355   K 4.7 11/03/2022 1355   CL 104 11/03/2022 1355   CO2 21 11/03/2022 1355   GLUCOSE 258 (H) 11/03/2022 1355   GLUCOSE 148 (H) 02/14/2018 1424   BUN 19 11/03/2022 1355   CREATININE 1.18 11/03/2022 1355   CREATININE 0.87 09/01/2015 1004   CALCIUM 9.4 11/03/2022 1355    GFRNONAA 84 05/07/2019 1046   GFRNONAA >89 09/01/2015 1004   GFRAA 98 05/07/2019 1046   GFRAA >89 09/01/2015 1004      Assessment & Plan:   Diabetes mellitus (HCC) - POCT glycosylated hemoglobin (Hb A1C) - metFORMIN (GLUCOPHAGE-XR) 500 MG 24 hr tablet; Take 1 tablet (500 mg total) by mouth 2 (two) times daily with a meal.  Dispense: 60 tablet; Refill: 2 - CBC - Comprehensive metabolic panel - Microalbumin/Creatinine Ratio, Urine  2. Weight loss  - mirtazapine (REMERON) 30 MG tablet; Take 1 tablet (30 mg total) by mouth at bedtime.  Dispense: 30 tablet; Refill: 2 - CBC -  Comprehensive metabolic panel - Ambulatory referral to Gastroenterology  3. Lipid screening  - Lipid Panel  4. Colon cancer screening  - Ambulatory referral to Gastroenterology  5. Vomiting, unspecified vomiting type, unspecified whether nausea present  - Ambulatory referral to Gastroenterology  -may need endoscopy  Follow up:  Follow up in 3 months     Ivonne Andrew, NP 02/04/2023

## 2023-02-04 NOTE — Patient Instructions (Signed)
1. Type 2 diabetes mellitus with hyperglycemia, with long-term current use of insulin (HCC)  - POCT glycosylated hemoglobin (Hb A1C) - metFORMIN (GLUCOPHAGE-XR) 500 MG 24 hr tablet; Take 1 tablet (500 mg total) by mouth 2 (two) times daily with a meal.  Dispense: 60 tablet; Refill: 2 - CBC - Comprehensive metabolic panel - Microalbumin/Creatinine Ratio, Urine  2. Weight loss  - mirtazapine (REMERON) 30 MG tablet; Take 1 tablet (30 mg total) by mouth at bedtime.  Dispense: 30 tablet; Refill: 2 - CBC - Comprehensive metabolic panel - Ambulatory referral to Gastroenterology  3. Lipid screening  - Lipid Panel  4. Colon cancer screening  - Ambulatory referral to Gastroenterology  5. Vomiting, unspecified vomiting type, unspecified whether nausea present  - Ambulatory referral to Gastroenterology  -may need endoscopy  Follow up:  Follow up in 3 months

## 2023-02-05 LAB — COMPREHENSIVE METABOLIC PANEL
ALT: 19 IU/L (ref 0–44)
AST: 19 IU/L (ref 0–40)
Albumin: 4.2 g/dL (ref 3.9–4.9)
Alkaline Phosphatase: 69 IU/L (ref 44–121)
BUN/Creatinine Ratio: 13 (ref 10–24)
BUN: 15 mg/dL (ref 8–27)
Bilirubin Total: 0.7 mg/dL (ref 0.0–1.2)
CO2: 24 mmol/L (ref 20–29)
Calcium: 9.4 mg/dL (ref 8.6–10.2)
Chloride: 107 mmol/L — ABNORMAL HIGH (ref 96–106)
Creatinine, Ser: 1.14 mg/dL (ref 0.76–1.27)
Globulin, Total: 2 g/dL (ref 1.5–4.5)
Glucose: 86 mg/dL (ref 70–99)
Potassium: 4.4 mmol/L (ref 3.5–5.2)
Sodium: 144 mmol/L (ref 134–144)
Total Protein: 6.2 g/dL (ref 6.0–8.5)
eGFR: 72 mL/min/{1.73_m2} (ref >59)

## 2023-02-05 LAB — CBC
Hematocrit: 34.5 % — ABNORMAL LOW (ref 37.5–51.0)
Hemoglobin: 12.2 g/dL — ABNORMAL LOW (ref 13.0–17.7)
MCH: 32.1 pg (ref 26.6–33.0)
MCHC: 35.4 g/dL (ref 31.5–35.7)
MCV: 91 fL (ref 79–97)
Platelets: 155 10*3/uL (ref 150–450)
RBC: 3.8 x10E6/uL — ABNORMAL LOW (ref 4.14–5.80)
RDW: 12.6 % (ref 11.6–15.4)
WBC: 4.8 10*3/uL (ref 3.4–10.8)

## 2023-02-05 LAB — LIPID PANEL
Chol/HDL Ratio: 2.6 ratio (ref 0.0–5.0)
Cholesterol, Total: 154 mg/dL (ref 100–199)
HDL: 59 mg/dL (ref >39)
LDL Chol Calc (NIH): 76 mg/dL (ref 0–99)
Triglycerides: 105 mg/dL (ref 0–149)
VLDL Cholesterol Cal: 19 mg/dL (ref 5–40)

## 2023-02-05 LAB — MICROALBUMIN / CREATININE URINE RATIO
Creatinine, Urine: 227.1 mg/dL
Microalb/Creat Ratio: 159 mg/g creat — ABNORMAL HIGH (ref 0–29)
Microalbumin, Urine: 361.2 ug/mL

## 2023-02-07 ENCOUNTER — Encounter: Payer: Self-pay | Admitting: Podiatry

## 2023-02-14 ENCOUNTER — Ambulatory Visit: Payer: Self-pay | Admitting: Podiatry

## 2023-03-16 ENCOUNTER — Other Ambulatory Visit: Payer: Self-pay

## 2023-03-17 ENCOUNTER — Other Ambulatory Visit: Payer: Self-pay

## 2023-04-06 ENCOUNTER — Encounter: Payer: Self-pay | Admitting: Nurse Practitioner

## 2023-04-06 ENCOUNTER — Ambulatory Visit (INDEPENDENT_AMBULATORY_CARE_PROVIDER_SITE_OTHER): Payer: Self-pay | Admitting: Nurse Practitioner

## 2023-04-06 ENCOUNTER — Other Ambulatory Visit: Payer: Self-pay

## 2023-04-06 VITALS — BP 98/55 | HR 60 | Temp 98.6°F | Resp 14 | Ht 64.0 in | Wt 117.6 lb

## 2023-04-06 DIAGNOSIS — Z794 Long term (current) use of insulin: Secondary | ICD-10-CM

## 2023-04-06 DIAGNOSIS — E1165 Type 2 diabetes mellitus with hyperglycemia: Secondary | ICD-10-CM

## 2023-04-06 DIAGNOSIS — R634 Abnormal weight loss: Secondary | ICD-10-CM

## 2023-04-06 DIAGNOSIS — E119 Type 2 diabetes mellitus without complications: Secondary | ICD-10-CM

## 2023-04-06 DIAGNOSIS — H547 Unspecified visual loss: Secondary | ICD-10-CM

## 2023-04-06 LAB — POCT GLYCOSYLATED HEMOGLOBIN (HGB A1C): HbA1c, POC (controlled diabetic range): 6.5 % (ref 0.0–7.0)

## 2023-04-06 MED ORDER — LANTUS SOLOSTAR 100 UNIT/ML ~~LOC~~ SOPN
30.0000 [IU] | PEN_INJECTOR | Freq: Every day | SUBCUTANEOUS | 2 refills | Status: DC
Start: 2023-04-06 — End: 2023-11-30
  Filled 2023-04-06: qty 9, 30d supply, fill #0
  Filled 2023-06-13: qty 15, 50d supply, fill #0

## 2023-04-06 MED ORDER — NOVOLOG FLEXPEN 100 UNIT/ML ~~LOC~~ SOPN
6.0000 [IU] | PEN_INJECTOR | Freq: Three times a day (TID) | SUBCUTANEOUS | 11 refills | Status: DC
Start: 2023-04-06 — End: 2023-11-30
  Filled 2023-04-06: qty 15, 83d supply, fill #0
  Filled 2023-04-07: qty 6, 33d supply, fill #0
  Filled 2023-06-13: qty 15, 83d supply, fill #0

## 2023-04-06 MED ORDER — METFORMIN HCL ER 500 MG PO TB24
500.0000 mg | ORAL_TABLET | Freq: Two times a day (BID) | ORAL | 2 refills | Status: DC
Start: 2023-04-06 — End: 2023-11-08
  Filled 2023-04-06 – 2023-06-13 (×2): qty 60, 30d supply, fill #0
  Filled 2023-08-05: qty 60, 30d supply, fill #1
  Filled 2023-09-12: qty 60, 30d supply, fill #2

## 2023-04-06 MED ORDER — MIRTAZAPINE 30 MG PO TABS
30.0000 mg | ORAL_TABLET | Freq: Every day | ORAL | 2 refills | Status: DC
Start: 2023-04-06 — End: 2024-05-31
  Filled 2023-04-06 – 2023-06-13 (×2): qty 30, 30d supply, fill #0
  Filled 2023-08-05: qty 30, 30d supply, fill #1
  Filled 2023-12-13: qty 30, 30d supply, fill #2

## 2023-04-06 NOTE — Assessment & Plan Note (Signed)
-   Ambulatory referral to Ophthalmology  2. Type 2 diabetes mellitus without complication, without long-term current use of insulin (HCC)  - POCT glycosylated hemoglobin (Hb A1C) - insulin glargine (LANTUS SOLOSTAR) 100 UNIT/ML Solostar Pen; Inject 30 Units into the skin at bedtime.  Dispense: 15 mL; Refill: 2  3. Weight loss  - mirtazapine (REMERON) 30 MG tablet; Take 1 tablet (30 mg total) by mouth at bedtime.  Dispense: 30 tablet; Refill: 2  4. Type 2 diabetes mellitus with hyperglycemia, with long-term current use of insulin (HCC)  - metFORMIN (GLUCOPHAGE-XR) 500 MG 24 hr tablet; Take 1 tablet (500 mg total) by mouth 2 (two) times daily with a meal.  Dispense: 60 tablet; Refill: 2 - insulin glargine (LANTUS SOLOSTAR) 100 UNIT/ML Solostar Pen; Inject 30 Units into the skin at bedtime.  Dispense: 15 mL; Refill: 2 - insulin aspart (NOVOLOG FLEXPEN) 100 UNIT/ML FlexPen; Inject 6 Units into the skin 3 (three) times daily with meals.  Dispense: 15 mL; Refill: 11  Follow up:  Follow up in 3 months

## 2023-04-06 NOTE — Patient Instructions (Signed)
1. Decreased vision  - Ambulatory referral to Ophthalmology  2. Type 2 diabetes mellitus without complication, without long-term current use of insulin (HCC)  - POCT glycosylated hemoglobin (Hb A1C) - insulin glargine (LANTUS SOLOSTAR) 100 UNIT/ML Solostar Pen; Inject 30 Units into the skin at bedtime.  Dispense: 15 mL; Refill: 2  3. Weight loss  - mirtazapine (REMERON) 30 MG tablet; Take 1 tablet (30 mg total) by mouth at bedtime.  Dispense: 30 tablet; Refill: 2  4. Type 2 diabetes mellitus with hyperglycemia, with long-term current use of insulin (HCC)  - metFORMIN (GLUCOPHAGE-XR) 500 MG 24 hr tablet; Take 1 tablet (500 mg total) by mouth 2 (two) times daily with a meal.  Dispense: 60 tablet; Refill: 2 - insulin glargine (LANTUS SOLOSTAR) 100 UNIT/ML Solostar Pen; Inject 30 Units into the skin at bedtime.  Dispense: 15 mL; Refill: 2 - insulin aspart (NOVOLOG FLEXPEN) 100 UNIT/ML FlexPen; Inject 6 Units into the skin 3 (three) times daily with meals.  Dispense: 15 mL; Refill: 11  Follow up:  Follow up in 3 months

## 2023-04-06 NOTE — Progress Notes (Signed)
@Patient  ID: Kevin Hubbard, male    DOB: 07-10-1961, 62 y.o.   MRN: [id number]  Chief Complaint  Patient presents with   Follow-up    Referring provider: Ivonne Andrew, NP   HPI  Kevin Hubbard 62 y.o. male  has a past medical history of Diabetes mellitus without complication (HCC). To the [facility] for reevaluation of chronic      Hypertension: Patient here for follow-up of elevated blood pressure. He is exercising and is adherent to low salt diet.  Patient does not check b/p at home. Blood pressures have been normal and he has stopped taking blood pressure medications. Cardiac symptoms none. Patient denies chest pain, dyspnea, fatigue, and near-syncope.  Cardiovascular risk factors: advanced age (older than 71 for men, 76 for women) and male gender. Use of agents associated with hypertension: none. History of target organ damage: none. States that he has not taken his B/P medications today.    Diabetes Mellitus: Patient presents for follow up of diabetes. Symptoms: none. Denies any symptoms. Evaluation to date has been included: hemoglobin A1C.  Home sugars: BGs have been labile ranging between 140-149. Treatment to date: no recent interventions. Patient states that he is no longer taking insulin. He states that his blood sugars were dropping too low. Will trial metformin 500 mg am and pm. Currently only taking once daily. A1C today is 6.5.   Reported fluctuating weight and appetite. No longer having vomiting. Will continue to monitor.    Denies any other concerns today. Denies any fatigue, chest pain, shortness of breath or HA. Denies any blurred vision, numbness or tingling.  Note: patient does complain of decreased vision. He is requesting a referral to eye doctor. Will place referral today.     No Known Allergies  Immunization History  Administered Date(s) Administered   Influenza,inj,Quad PF,6+ Mos 09/01/2015, 09/29/2016, 03/31/2018   Moderna SARS-COV2 Booster Vaccination  11/26/2020   Moderna Sars-Covid-2 Vaccination 05/27/2020, 06/24/2020   Pneumococcal Polysaccharide-23 09/29/2016   Tdap 02/18/2015, 09/01/2015    Past Medical History:  Diagnosis Date   Diabetes mellitus without complication (HCC)     Tobacco History: Social History   Tobacco Use  Smoking Status Former   Current packs/day: 0.00   Types: Cigarettes   Quit date: 07/17/2012   Years since quitting: 10.7  Smokeless Tobacco Never   Counseling given: Not Answered   Outpatient Encounter Medications as of 04/06/2023  Medication Sig   Blood Glucose Monitoring Suppl (TRUE METRIX METER) w/Device KIT use as directed 4 (four) times daily -  with meals and at bedtime.   glucose blood (TRUE METRIX BLOOD GLUCOSE TEST) test strip Use as instructed   Insulin Pen Needle 31G X 8 MM MISC use to inject insulin 4 (four) times daily.   Insulin Syringe-Needle U-100 (TRUEPLUS INSULIN SYRINGE) 31G X 5/16" 0.3 ML MISC USE AS DIRECTED 4 TIMES DAILY WITH MEALS AND AT BEDTIME   lisinopril (ZESTRIL) 10 MG tablet Take 1 tablet (10 mg total) by mouth daily.   Multiple Vitamin (MULTIVITAMIN WITH MINERALS) TABS tablet Take 1 tablet by mouth daily.   TRUEplus Lancets 28G MISC Use as instructed   [DISCONTINUED] insulin aspart (NOVOLOG FLEXPEN) 100 UNIT/ML FlexPen Inject 6 Units into the skin 3 (three) times daily with meals.   [DISCONTINUED] insulin glargine (LANTUS SOLOSTAR) 100 UNIT/ML Solostar Pen Inject 30 Units into the skin at bedtime.   [DISCONTINUED] metFORMIN (GLUCOPHAGE-XR) 500 MG 24 hr tablet Take 1 tablet (500 mg total) by mouth 2 (two)  times daily with a meal.   [DISCONTINUED] mirtazapine (REMERON) 30 MG tablet Take 1 tablet (30 mg total) by mouth at bedtime.   insulin aspart (NOVOLOG FLEXPEN) 100 UNIT/ML FlexPen Inject 6 Units into the skin 3 (three) times daily with meals.   insulin glargine (LANTUS SOLOSTAR) 100 UNIT/ML Solostar Pen Inject 30 Units into the skin at bedtime.   metFORMIN (GLUCOPHAGE-XR)  500 MG 24 hr tablet Take 1 tablet (500 mg total) by mouth 2 (two) times daily with a meal.   mirtazapine (REMERON) 30 MG tablet Take 1 tablet (30 mg total) by mouth at bedtime.   No facility-administered encounter medications on file as of 04/06/2023.     Review of Systems  Review of Systems  Constitutional: Negative.   HENT: Negative.    Cardiovascular: Negative.   Gastrointestinal: Negative.   Allergic/Immunologic: Negative.   Neurological: Negative.   Psychiatric/Behavioral: Negative.         Physical Exam  BP (!) 98/55 (BP Location: Left Arm, Patient Position: Sitting, Cuff Size: Normal)   Pulse 60   Temp 98.6 F (37 C)   Resp 14   Ht 5\' 4"  (1.626 m)   Wt 117 lb 9.6 oz (53.3 kg)   SpO2 98%   BMI 20.19 kg/m   Wt Readings from Last 5 Encounters:  04/06/23 117 lb 9.6 oz (53.3 kg)  02/04/23 114 lb (51.7 kg)  11/03/22 120 lb 9.6 oz (54.7 kg)  01/19/22 123 lb (55.8 kg)  10/19/21 121 lb 9.6 oz (55.2 kg)     Physical Exam Vitals and nursing note reviewed.  Constitutional:      General: He is not in acute distress.    Appearance: He is well-developed.  Cardiovascular:     Rate and Rhythm: Normal rate and regular rhythm.  Pulmonary:     Effort: Pulmonary effort is normal.     Breath sounds: Normal breath sounds.  Skin:    General: Skin is warm and dry.  Neurological:     Mental Status: He is alert and oriented to person, place, and time.      Lab Results:  CBC    Component Value Date/Time   WBC 4.8 02/04/2023 1414   WBC 10.1 02/14/2018 1424   RBC 3.80 (L) 02/04/2023 1414   RBC 5.41 02/14/2018 1424   HGB 12.2 (L) 02/04/2023 1414   HCT 34.5 (L) 02/04/2023 1414   PLT 155 02/04/2023 1414   MCV 91 02/04/2023 1414   MCH 32.1 02/04/2023 1414   MCH 31.2 02/14/2018 1424   MCHC 35.4 02/04/2023 1414   MCHC 35.6 02/14/2018 1424   RDW 12.6 02/04/2023 1414   LYMPHSABS 1.2 01/19/2022 1018   MONOABS 0.4 12/22/2017 2047   EOSABS 0.1 01/19/2022 1018   BASOSABS  0.1 01/19/2022 1018    BMET    Component Value Date/Time   NA 144 02/04/2023 1414   K 4.4 02/04/2023 1414   CL 107 (H) 02/04/2023 1414   CO2 24 02/04/2023 1414   GLUCOSE 86 02/04/2023 1414   GLUCOSE 148 (H) 02/14/2018 1424   BUN 15 02/04/2023 1414   CREATININE 1.14 02/04/2023 1414   CREATININE 0.87 09/01/2015 1004   CALCIUM 9.4 02/04/2023 1414   GFRNONAA 84 05/07/2019 1046   GFRNONAA >89 09/01/2015 1004   GFRAA 98 05/07/2019 1046   GFRAA >89 09/01/2015 1004      Assessment & Plan:   Decreased vision - Ambulatory referral to Ophthalmology  2. Type 2 diabetes mellitus without complication, without  long-term current use of insulin (HCC)  - POCT glycosylated hemoglobin (Hb A1C) - insulin glargine (LANTUS SOLOSTAR) 100 UNIT/ML Solostar Pen; Inject 30 Units into the skin at bedtime.  Dispense: 15 mL; Refill: 2  3. Weight loss  - mirtazapine (REMERON) 30 MG tablet; Take 1 tablet (30 mg total) by mouth at bedtime.  Dispense: 30 tablet; Refill: 2  4. Type 2 diabetes mellitus with hyperglycemia, with long-term current use of insulin (HCC)  - metFORMIN (GLUCOPHAGE-XR) 500 MG 24 hr tablet; Take 1 tablet (500 mg total) by mouth 2 (two) times daily with a meal.  Dispense: 60 tablet; Refill: 2 - insulin glargine (LANTUS SOLOSTAR) 100 UNIT/ML Solostar Pen; Inject 30 Units into the skin at bedtime.  Dispense: 15 mL; Refill: 2 - insulin aspart (NOVOLOG FLEXPEN) 100 UNIT/ML FlexPen; Inject 6 Units into the skin 3 (three) times daily with meals.  Dispense: 15 mL; Refill: 11  Follow up:  Follow up in 3 months     Ivonne Andrew, NP 04/06/2023

## 2023-04-07 ENCOUNTER — Other Ambulatory Visit: Payer: Self-pay

## 2023-04-12 ENCOUNTER — Other Ambulatory Visit: Payer: Self-pay

## 2023-04-13 ENCOUNTER — Other Ambulatory Visit: Payer: Self-pay

## 2023-04-29 ENCOUNTER — Other Ambulatory Visit: Payer: Self-pay

## 2023-05-06 ENCOUNTER — Ambulatory Visit: Payer: Self-pay | Admitting: Nurse Practitioner

## 2023-05-16 ENCOUNTER — Other Ambulatory Visit: Payer: Self-pay

## 2023-06-13 ENCOUNTER — Other Ambulatory Visit: Payer: Self-pay

## 2023-07-06 ENCOUNTER — Ambulatory Visit: Payer: Self-pay | Admitting: Nurse Practitioner

## 2023-08-05 ENCOUNTER — Other Ambulatory Visit: Payer: Self-pay

## 2023-08-31 ENCOUNTER — Ambulatory Visit (INDEPENDENT_AMBULATORY_CARE_PROVIDER_SITE_OTHER): Payer: Self-pay | Admitting: Nurse Practitioner

## 2023-08-31 ENCOUNTER — Encounter: Payer: Self-pay | Admitting: Nurse Practitioner

## 2023-08-31 VITALS — BP 121/77 | HR 67 | Temp 97.5°F | Wt 122.0 lb

## 2023-08-31 DIAGNOSIS — E1165 Type 2 diabetes mellitus with hyperglycemia: Secondary | ICD-10-CM

## 2023-08-31 DIAGNOSIS — Z794 Long term (current) use of insulin: Secondary | ICD-10-CM

## 2023-08-31 LAB — POCT GLYCOSYLATED HEMOGLOBIN (HGB A1C): Hemoglobin A1C: 6.3 % — AB (ref 4.0–5.6)

## 2023-08-31 NOTE — Progress Notes (Signed)
Subjective   Patient ID: Kevin Hubbard, male    DOB: January 24, 1961, 63 y.o.   MRN: [id number]  Chief Complaint  Patient presents with   Diabetes    Follow up Patient stated that his numbers has been 150,200,81 depending on what he eats    Referring provider: Ivonne Andrew, NP  Kevin Hubbard is a 62 y.o. male with Past Medical History: No date: Diabetes mellitus without complication (HCC)   HPI  Hypertension: Patient here for follow-up of elevated blood pressure. He is exercising and is adherent to low salt diet.  Patient does not check b/p at home. Blood pressures have been normal and he has stopped taking blood pressure medications. Cardiac symptoms none. Patient denies chest pain, dyspnea, fatigue, and near-syncope.  Cardiovascular risk factors: advanced age (older than 70 for men, 76 for women) and male gender. Use of agents associated with hypertension: none. History of target organ damage: none. States that he has not taken his B/P medications today.    Diabetes Mellitus: Patient presents for follow up of diabetes. Symptoms: none. Denies any symptoms. Evaluation to date has been included: hemoglobin A1C.  Home sugars: BGs have been labile ranging between 81-200. Treatment to date: no recent interventions. Patient states that he is no longer taking insulin. He states that his blood sugars were dropping too low. Currently on metformin 500 mg am and pm. Currently only taking once daily. A1C today is 6.3.    Denies any other concerns today. Denies any fatigue, chest pain, shortness of breath or HA. Denies any blurred vision, numbness or tingling.   No Known Allergies  Immunization History  Administered Date(s) Administered   Influenza,inj,Quad PF,6+ Mos 09/01/2015, 09/29/2016, 03/31/2018   Moderna SARS-COV2 Booster Vaccination 11/26/2020   Moderna Sars-Covid-2 Vaccination 05/27/2020, 06/24/2020   Pneumococcal Polysaccharide-23 09/29/2016   Tdap 02/18/2015, 09/01/2015     Tobacco History: Social History   Tobacco Use  Smoking Status Former   Current packs/day: 0.00   Types: Cigarettes   Quit date: 07/17/2012   Years since quitting: 11.1  Smokeless Tobacco Never   Counseling given: Not Answered   Outpatient Encounter Medications as of 08/31/2023  Medication Sig   Blood Glucose Monitoring Suppl (TRUE METRIX METER) w/Device KIT use as directed 4 (four) times daily -  with meals and at bedtime.   glucose blood (TRUE METRIX BLOOD GLUCOSE TEST) test strip Use as instructed   insulin aspart (NOVOLOG FLEXPEN) 100 UNIT/ML FlexPen Inject 6 Units into the skin 3 (three) times daily with meals.If blood sugar is < 70...Marland KitchenMarland KitchenHold insulin   insulin glargine (LANTUS SOLOSTAR) 100 UNIT/ML Solostar Pen Inject 30 Units into the skin at bedtime.   Insulin Pen Needle 31G X 8 MM MISC use to inject insulin 4 (four) times daily.   Insulin Syringe-Needle U-100 (TRUEPLUS INSULIN SYRINGE) 31G X 5/16" 0.3 ML MISC USE AS DIRECTED 4 TIMES DAILY WITH MEALS AND AT BEDTIME   metFORMIN (GLUCOPHAGE-XR) 500 MG 24 hr tablet Take 1 tablet (500 mg total) by mouth 2 (two) times daily with a meal.   TRUEplus Lancets 28G MISC Use as instructed   lisinopril (ZESTRIL) 10 MG tablet Take 1 tablet (10 mg total) by mouth daily. (Patient not taking: Reported on 08/31/2023)   mirtazapine (REMERON) 30 MG tablet Take 1 tablet (30 mg total) by mouth at bedtime. (Patient not taking: Reported on 08/31/2023)   Multiple Vitamin (MULTIVITAMIN WITH MINERALS) TABS tablet Take 1 tablet by mouth daily. (Patient not taking: Reported on 08/31/2023)  No facility-administered encounter medications on file as of 08/31/2023.    Review of Systems  Review of Systems  Constitutional: Negative.   HENT: Negative.    Cardiovascular: Negative.   Gastrointestinal: Negative.   Allergic/Immunologic: Negative.   Neurological: Negative.   Psychiatric/Behavioral: Negative.       Objective:   BP 121/77   Pulse 67    Temp (!) 97.5 F (36.4 C)   Wt 122 lb (55.3 kg)   SpO2 100%   BMI 20.94 kg/m   Wt Readings from Last 5 Encounters:  08/31/23 122 lb (55.3 kg)  04/06/23 117 lb 9.6 oz (53.3 kg)  02/04/23 114 lb (51.7 kg)  11/03/22 120 lb 9.6 oz (54.7 kg)  01/19/22 123 lb (55.8 kg)     Physical Exam Vitals and nursing note reviewed.  Constitutional:      General: He is not in acute distress.    Appearance: He is well-developed.  Cardiovascular:     Rate and Rhythm: Normal rate and regular rhythm.  Pulmonary:     Effort: Pulmonary effort is normal.     Breath sounds: Normal breath sounds.  Skin:    General: Skin is warm and dry.  Neurological:     Mental Status: He is alert and oriented to person, place, and time.       Assessment & Plan:   Type 2 diabetes mellitus with hyperglycemia, with long-term current use of insulin (HCC) -     POCT glycosylated hemoglobin (Hb A1C) -     AMB Referral [facility] Care Management -     CBC -     Comprehensive metabolic panel     Return in about 3 months (around 11/29/2023).   Ivonne Andrew, NP 08/31/2023

## 2023-09-01 LAB — CBC
Hematocrit: 36.3 % — ABNORMAL LOW (ref 37.5–51.0)
Hemoglobin: 12.9 g/dL — ABNORMAL LOW (ref 13.0–17.7)
MCH: 32.1 pg (ref 26.6–33.0)
MCHC: 35.5 g/dL (ref 31.5–35.7)
MCV: 90 fL (ref 79–97)
Platelets: 149 10*3/uL — ABNORMAL LOW (ref 150–450)
RBC: 4.02 x10E6/uL — ABNORMAL LOW (ref 4.14–5.80)
RDW: 11.8 % (ref 11.6–15.4)
WBC: 5.3 10*3/uL (ref 3.4–10.8)

## 2023-09-01 LAB — COMPREHENSIVE METABOLIC PANEL
ALT: 15 [IU]/L (ref 0–44)
AST: 18 [IU]/L (ref 0–40)
Albumin: 4.2 g/dL (ref 3.9–4.9)
Alkaline Phosphatase: 66 [IU]/L (ref 44–121)
BUN/Creatinine Ratio: 15 (ref 10–24)
BUN: 18 mg/dL (ref 8–27)
Bilirubin Total: 0.8 mg/dL (ref 0.0–1.2)
CO2: 20 mmol/L (ref 20–29)
Calcium: 9.4 mg/dL (ref 8.6–10.2)
Chloride: 105 mmol/L (ref 96–106)
Creatinine, Ser: 1.18 mg/dL (ref 0.76–1.27)
Globulin, Total: 2 g/dL (ref 1.5–4.5)
Glucose: 94 mg/dL (ref 70–99)
Potassium: 4.8 mmol/L (ref 3.5–5.2)
Sodium: 140 mmol/L (ref 134–144)
Total Protein: 6.2 g/dL (ref 6.0–8.5)
eGFR: 69 mL/min/{1.73_m2} (ref >59)

## 2023-09-12 ENCOUNTER — Other Ambulatory Visit: Payer: Self-pay

## 2023-09-20 ENCOUNTER — Telehealth: Payer: Self-pay

## 2023-09-20 DIAGNOSIS — Z79899 Other long term (current) drug therapy: Secondary | ICD-10-CM

## 2023-09-20 NOTE — Progress Notes (Signed)
   09/20/2023  Patient ID: Kevin Hubbard, male   DOB: Feb 03, 1961, 63 y.o.   MRN: [id number]  Attempted to contact patient for medication management/review. Left HIPAA compliant message for patient to return my call at their convenience.   First attempt for patient outreach. Will follow up with patient in 7-10 business days.  Thank you for allowing pharmacy to be a part of this patient's care.  Cephus Shelling, PharmD Clinical Pharmacist Cell: [phone]

## 2023-10-05 ENCOUNTER — Telehealth: Payer: Self-pay

## 2023-10-05 NOTE — Progress Notes (Signed)
   10/05/2023  Patient ID: Kevin Hubbard, male   DOB: May 25, 1961, 63 y.o.   MRN: [id number]  Attempted to contact patient for medication management/review.Unable to leave a HIPAA compliant message for patient to return my call at their convenience.   Second attempt for patient outreach. Will follow up with patient in 7-10 business days.  Thank you for allowing pharmacy to be a part of this patient's care.  Cephus Shelling, PharmD Clinical Pharmacist Cell: [phone]

## 2023-10-12 ENCOUNTER — Telehealth: Payer: Self-pay

## 2023-10-12 DIAGNOSIS — E119 Type 2 diabetes mellitus without complications: Secondary | ICD-10-CM

## 2023-10-12 NOTE — Progress Notes (Signed)
   10/12/2023  Patient ID: Kevin Hubbard, male   DOB: 07-27-1961, 63 y.o.   MRN: [id number]  Attempted to contact patient for medication management/review.Unable to leave a HIPAA compliant message for patient to return my call at their convenience.   Third attempt for patient outreach. Will close referral as I have been unable to reach or sustain contact.  Thank you for allowing pharmacy to be a part of this patient's care.  Cephus Shelling, PharmD Clinical Pharmacist Cell: [phone]

## 2023-10-12 NOTE — Patient Outreach (Signed)
 Care Coordination   Docmentation  Visit Note   10/12/2023 Name: Mahmood Boehringer MRN: [id number] DOB: 09/02/1960  Coleson Kant is a 63 y.o. year old male who sees Ivonne Andrew, NP for primary care. I  received call from patient asking about a phone call received.  Advised patient that Cephus Shelling called and patient given contact information  Patient verbalized understanding.     Encounter Outcome:  Patient Visit Completed   Bary Leriche RN, MSN [facility], [facility] RN Care Manager Direct Dial: [phone]  Fax: [phone] Website: [email or web address]

## 2023-11-08 ENCOUNTER — Other Ambulatory Visit: Payer: Self-pay | Admitting: Nurse Practitioner

## 2023-11-08 ENCOUNTER — Other Ambulatory Visit: Payer: Self-pay

## 2023-11-08 DIAGNOSIS — E1165 Type 2 diabetes mellitus with hyperglycemia: Secondary | ICD-10-CM

## 2023-11-08 MED ORDER — METFORMIN HCL ER 500 MG PO TB24
500.0000 mg | ORAL_TABLET | Freq: Two times a day (BID) | ORAL | 2 refills | Status: DC
  Filled 2023-11-08: qty 60, 30d supply, fill #0

## 2023-11-08 MED ORDER — TRUE METRIX BLOOD GLUCOSE TEST VI STRP
ORAL_STRIP | 12 refills | Status: AC
  Filled 2023-11-08: qty 100, 25d supply, fill #0
  Filled 2024-05-31: qty 100, 25d supply, fill #1

## 2023-11-09 ENCOUNTER — Other Ambulatory Visit: Payer: Self-pay

## 2023-11-30 ENCOUNTER — Other Ambulatory Visit: Payer: Self-pay

## 2023-11-30 ENCOUNTER — Ambulatory Visit (INDEPENDENT_AMBULATORY_CARE_PROVIDER_SITE_OTHER): Payer: Self-pay | Admitting: Nurse Practitioner

## 2023-11-30 ENCOUNTER — Encounter: Payer: Self-pay | Admitting: Nurse Practitioner

## 2023-11-30 ENCOUNTER — Ambulatory Visit: Payer: Self-pay | Admitting: Nurse Practitioner

## 2023-11-30 VITALS — BP 109/69 | HR 66 | Temp 98.4°F | Wt 117.6 lb

## 2023-11-30 DIAGNOSIS — E1165 Type 2 diabetes mellitus with hyperglycemia: Secondary | ICD-10-CM

## 2023-11-30 DIAGNOSIS — E119 Type 2 diabetes mellitus without complications: Secondary | ICD-10-CM

## 2023-11-30 DIAGNOSIS — H547 Unspecified visual loss: Secondary | ICD-10-CM

## 2023-11-30 DIAGNOSIS — R82998 Other abnormal findings in urine: Secondary | ICD-10-CM

## 2023-11-30 DIAGNOSIS — Z794 Long term (current) use of insulin: Secondary | ICD-10-CM

## 2023-11-30 LAB — POCT URINALYSIS DIP (CLINITEK)
Blood, UA: NEGATIVE
Glucose, UA: 250 mg/dL — AB
Leukocytes, UA: NEGATIVE
Nitrite, UA: NEGATIVE
POC PROTEIN,UA: >=300 — AB
Spec Grav, UA: >=1.03 — AB (ref 1.010–1.025)
Urobilinogen, UA: 1 U/dL
pH, UA: 5.5 (ref 5.0–8.0)

## 2023-11-30 LAB — POCT GLYCOSYLATED HEMOGLOBIN (HGB A1C): Hemoglobin A1C: 7.4 % — AB (ref 4.0–5.6)

## 2023-11-30 MED ORDER — INSULIN LISPRO (1 UNIT DIAL) 100 UNIT/ML (KWIKPEN)
6.0000 [IU] | PEN_INJECTOR | Freq: Three times a day (TID) | SUBCUTANEOUS | 12 refills | Status: DC
Start: 2023-11-30 — End: 2024-04-06
  Filled 2023-11-30: qty 6, 34d supply, fill #0

## 2023-11-30 MED ORDER — NOVOLOG FLEXPEN 100 UNIT/ML ~~LOC~~ SOPN
6.0000 [IU] | PEN_INJECTOR | Freq: Three times a day (TID) | SUBCUTANEOUS | 11 refills | Status: DC
  Filled 2023-11-30: qty 15, 83d supply, fill #0

## 2023-11-30 MED ORDER — LANTUS SOLOSTAR 100 UNIT/ML ~~LOC~~ SOPN
30.0000 [IU] | PEN_INJECTOR | Freq: Every day | SUBCUTANEOUS | 2 refills | Status: DC
  Filled 2023-11-30: qty 9, 30d supply, fill #0

## 2023-11-30 MED ORDER — METFORMIN HCL ER 500 MG PO TB24
500.0000 mg | ORAL_TABLET | Freq: Two times a day (BID) | ORAL | 2 refills | Status: DC
  Filled 2023-11-30 – 2023-12-13 (×2): qty 60, 30d supply, fill #0

## 2023-11-30 NOTE — Progress Notes (Signed)
 Subjective   Patient ID: Kevin Hubbard, male    DOB: 1961-07-08, 63 y.o.   MRN: [id number]  Chief Complaint  Patient presents with   Diabetes    Follow up     Referring provider: Jerrlyn Morel, NP  Kevin Hubbard is a 63 y.o. male with Past Medical History: No date: Diabetes mellitus without complication (HCC)   HPI  Hypertension: Patient here for follow-up of elevated blood pressure. He is exercising and is adherent to low salt diet.  Patient does not check b/p at home. Blood pressures have been normal and he has stopped taking blood pressure medications. Cardiac symptoms none. Patient denies chest pain, dyspnea, fatigue, and near-syncope.  Cardiovascular risk factors: advanced age (older than 63 for men, 48 for women) and male gender. Use of agents associated with hypertension: none. History of target organ damage: none. States that he has not taken his B/P medications today.    Diabetes Mellitus: Patient presents for follow up of diabetes. Symptoms: none. Denies any symptoms. Evaluation to date has been included: hemoglobin A1C.  Home sugars: BGs have been labile ranging between 81-200. Treatment to date: no recent interventions. Patient states that he is no longer taking insulin . He states that his blood sugars were dropping too low. Currently on metformin  500 mg am and pm. Currently only taking once daily. A1C today is 7.4.    Denies any other concerns today. Denies any fatigue, chest pain, shortness of breath or HA. Denies any blurred vision, numbness or tingling.  Notes: patient would like to have his urine checked. He has noticed that it has been been foamy.     No Known Allergies  Immunization History  Administered Date(s) Administered   Influenza,inj,Quad PF,6+ Mos 09/01/2015, 09/29/2016, 03/31/2018   Moderna SARS-COV2 Booster Vaccination 11/26/2020   Moderna Sars-Covid-2 Vaccination 05/27/2020, 06/24/2020   Pneumococcal Polysaccharide-23 09/29/2016   Tdap  02/18/2015, 09/01/2015    Tobacco History: Social History   Tobacco Use  Smoking Status Former   Current packs/day: 0.00   Types: Cigarettes   Quit date: 07/17/2012   Years since quitting: 11.3  Smokeless Tobacco Never   Counseling given: Not Answered   Outpatient Encounter Medications as of 11/30/2023  Medication Sig   Blood Glucose Monitoring Suppl (TRUE METRIX METER) w/Device KIT use as directed 4 (four) times daily -  with meals and at bedtime.   glucose blood (TRUE METRIX BLOOD GLUCOSE TEST) test strip Use as instructed   Insulin  Pen Needle 31G X 8 MM MISC use to inject insulin  4 (four) times daily.   Insulin  Syringe-Needle U-100 (TRUEPLUS INSULIN  SYRINGE) 31G X 5/16" 0.3 ML MISC USE AS DIRECTED 4 TIMES DAILY WITH MEALS AND AT BEDTIME   TRUEplus Lancets 28G MISC Use as instructed   [DISCONTINUED] insulin  aspart (NOVOLOG  FLEXPEN) 100 UNIT/ML FlexPen Inject 6 Units into the skin 3 (three) times daily with meals.If blood sugar is < 70...Aaron AasAaron AasHold insulin    [DISCONTINUED] insulin  glargine (LANTUS  SOLOSTAR) 100 UNIT/ML Solostar Pen Inject 30 Units into the skin at bedtime.   [DISCONTINUED] metFORMIN  (GLUCOPHAGE -XR) 500 MG 24 hr tablet Take 1 tablet (500 mg total) by mouth 2 (two) times daily with a meal.   insulin  aspart (NOVOLOG  FLEXPEN) 100 UNIT/ML FlexPen Inject 6 Units into the skin 3 (three) times daily with meals.If blood sugar is < 70...Aaron AasAaron AasHold insulin    insulin  glargine (LANTUS  SOLOSTAR) 100 UNIT/ML Solostar Pen Inject 30 Units into the skin at bedtime.   lisinopril  (ZESTRIL ) 10 MG tablet Take 1  tablet (10 mg total) by mouth daily. (Patient not taking: Reported on 11/30/2023)   metFORMIN  (GLUCOPHAGE -XR) 500 MG 24 hr tablet Take 1 tablet (500 mg total) by mouth 2 (two) times daily with a meal.   mirtazapine  (REMERON ) 30 MG tablet Take 1 tablet (30 mg total) by mouth at bedtime. (Patient not taking: Reported on 11/30/2023)   Multiple Vitamin (MULTIVITAMIN WITH MINERALS) TABS tablet  Take 1 tablet by mouth daily. (Patient not taking: Reported on 08/31/2023)   No facility-administered encounter medications on file as of 11/30/2023.    Review of Systems  Review of Systems  Constitutional: Negative.   HENT: Negative.    Cardiovascular: Negative.   Gastrointestinal: Negative.   Allergic/Immunologic: Negative.   Neurological: Negative.   Psychiatric/Behavioral: Negative.       Objective:   BP 109/69   Pulse 66   Temp 98.4 F (36.9 C) (Oral)   Wt 117 lb 9.6 oz (53.3 kg)   SpO2 100%   BMI 20.19 kg/m   Wt Readings from Last 5 Encounters:  11/30/23 117 lb 9.6 oz (53.3 kg)  08/31/23 122 lb (55.3 kg)  04/06/23 117 lb 9.6 oz (53.3 kg)  02/04/23 114 lb (51.7 kg)  11/03/22 120 lb 9.6 oz (54.7 kg)     Physical Exam Vitals and nursing note reviewed.  Constitutional:      General: He is not in acute distress.    Appearance: He is well-developed.  Cardiovascular:     Rate and Rhythm: Normal rate and regular rhythm.  Pulmonary:     Effort: Pulmonary effort is normal.     Breath sounds: Normal breath sounds.  Skin:    General: Skin is warm and dry.  Neurological:     Mental Status: He is alert and oriented to person, place, and time.       Assessment & Plan:   Type 2 diabetes mellitus without complication, without long-term current use of insulin  (HCC) -     POCT glycosylated hemoglobin (Hb A1C) -     CBC -     Comprehensive metabolic panel with GFR -     Lipid panel -     Lantus  SoloStar; Inject 30 Units into the skin at bedtime.  Dispense: 15 mL; Refill: 2  Type 2 diabetes mellitus with hyperglycemia, with long-term current use of insulin  (HCC) -     Lantus  SoloStar; Inject 30 Units into the skin at bedtime.  Dispense: 15 mL; Refill: 2 -     NovoLOG  FlexPen; Inject 6 Units into the skin 3 (three) times daily with meals.If blood sugar is < 70...Aaron AasAaron AasHold insulin   Dispense: 15 mL; Refill: 11 -     metFORMIN  HCl ER; Take 1 tablet (500 mg total) by mouth  2 (two) times daily with a meal.  Dispense: 60 tablet; Refill: 2  Foamy urine -     POCT URINALYSIS DIP (CLINITEK)  Decreased vision -     Ambulatory referral to Optometry     Return in about 3 months (around 02/29/2024).   Jerrlyn Morel, NP 11/30/2023

## 2023-12-01 LAB — COMPREHENSIVE METABOLIC PANEL WITH GFR
ALT: 15 IU/L (ref 0–44)
AST: 17 IU/L (ref 0–40)
Albumin: 4.2 g/dL (ref 3.9–4.9)
Alkaline Phosphatase: 83 IU/L (ref 44–121)
BUN/Creatinine Ratio: 14 (ref 10–24)
BUN: 20 mg/dL (ref 8–27)
Bilirubin Total: 1.2 mg/dL (ref 0.0–1.2)
CO2: 23 mmol/L (ref 20–29)
Calcium: 9.4 mg/dL (ref 8.6–10.2)
Chloride: 106 mmol/L (ref 96–106)
Creatinine, Ser: 1.43 mg/dL — ABNORMAL HIGH (ref 0.76–1.27)
Globulin, Total: 1.9 g/dL (ref 1.5–4.5)
Glucose: 159 mg/dL — ABNORMAL HIGH (ref 70–99)
Potassium: 4.8 mmol/L (ref 3.5–5.2)
Sodium: 142 mmol/L (ref 134–144)
Total Protein: 6.1 g/dL (ref 6.0–8.5)
eGFR: 55 mL/min/{1.73_m2} — ABNORMAL LOW (ref >59)

## 2023-12-01 LAB — CBC
Hematocrit: 38.6 % (ref 37.5–51.0)
Hemoglobin: 13.3 g/dL (ref 13.0–17.7)
MCH: 31.1 pg (ref 26.6–33.0)
MCHC: 34.5 g/dL (ref 31.5–35.7)
MCV: 90 fL (ref 79–97)
Platelets: 159 10*3/uL (ref 150–450)
RBC: 4.27 x10E6/uL (ref 4.14–5.80)
RDW: 12.3 % (ref 11.6–15.4)
WBC: 5.9 10*3/uL (ref 3.4–10.8)

## 2023-12-01 LAB — LIPID PANEL
Chol/HDL Ratio: 2.9 ratio (ref 0.0–5.0)
Cholesterol, Total: 171 mg/dL (ref 100–199)
HDL: 60 mg/dL (ref >39)
LDL Chol Calc (NIH): 97 mg/dL (ref 0–99)
Triglycerides: 73 mg/dL (ref 0–149)
VLDL Cholesterol Cal: 14 mg/dL (ref 5–40)

## 2023-12-12 ENCOUNTER — Other Ambulatory Visit: Payer: Self-pay

## 2023-12-13 ENCOUNTER — Other Ambulatory Visit: Payer: Self-pay

## 2023-12-13 ENCOUNTER — Ambulatory Visit: Payer: Self-pay

## 2024-02-29 ENCOUNTER — Ambulatory Visit: Payer: Self-pay | Admitting: Nurse Practitioner

## 2024-04-06 ENCOUNTER — Other Ambulatory Visit: Payer: Self-pay

## 2024-04-06 ENCOUNTER — Ambulatory Visit (INDEPENDENT_AMBULATORY_CARE_PROVIDER_SITE_OTHER): Payer: Self-pay | Admitting: Nurse Practitioner

## 2024-04-06 ENCOUNTER — Encounter: Payer: Self-pay | Admitting: Nurse Practitioner

## 2024-04-06 DIAGNOSIS — Z794 Long term (current) use of insulin: Secondary | ICD-10-CM

## 2024-04-06 DIAGNOSIS — I10 Essential (primary) hypertension: Secondary | ICD-10-CM

## 2024-04-06 DIAGNOSIS — E1165 Type 2 diabetes mellitus with hyperglycemia: Secondary | ICD-10-CM

## 2024-04-06 DIAGNOSIS — E119 Type 2 diabetes mellitus without complications: Secondary | ICD-10-CM

## 2024-04-06 LAB — POCT GLYCOSYLATED HEMOGLOBIN (HGB A1C): Hemoglobin A1C: 6.6 % — AB (ref 4.0–5.6)

## 2024-04-06 MED ORDER — NOVOLOG FLEXPEN 100 UNIT/ML ~~LOC~~ SOPN
6.0000 [IU] | PEN_INJECTOR | Freq: Three times a day (TID) | SUBCUTANEOUS | 11 refills | Status: DC
  Filled 2024-04-06: qty 15, 83d supply, fill #0

## 2024-04-06 MED ORDER — INSULIN LISPRO (1 UNIT DIAL) 100 UNIT/ML (KWIKPEN)
6.0000 [IU] | PEN_INJECTOR | Freq: Three times a day (TID) | SUBCUTANEOUS | 12 refills | Status: DC
  Filled 2024-04-06: qty 6, 34d supply, fill #0
  Filled 2024-05-31: qty 6, 34d supply, fill #1

## 2024-04-06 MED ORDER — METFORMIN HCL ER 500 MG PO TB24
500.0000 mg | ORAL_TABLET | Freq: Two times a day (BID) | ORAL | 2 refills | Status: DC
  Filled 2024-04-06: qty 60, 30d supply, fill #0
  Filled 2024-05-31: qty 60, 30d supply, fill #1

## 2024-04-06 MED ORDER — LANTUS SOLOSTAR 100 UNIT/ML ~~LOC~~ SOPN
30.0000 [IU] | PEN_INJECTOR | Freq: Every day | SUBCUTANEOUS | 2 refills | Status: DC
  Filled 2024-04-06: qty 9, 30d supply, fill #0
  Filled 2024-05-31: qty 9, 30d supply, fill #1

## 2024-04-06 NOTE — Progress Notes (Signed)
 Subjective   Patient ID: Kevin Hubbard, male    DOB: 12/31/1960, 63 y.o.   MRN: [id number]  Chief Complaint  Patient presents with   Diabetes    Referring provider: Oley Bascom RAMAN, NP  Kevin Hubbard is a 63 y.o. male with Past Medical History: No date: Diabetes mellitus without complication (HCC)   HPI  Hypertension: Patient here for follow-up of elevated blood pressure. He is exercising and is adherent to low salt diet.  Patient does not check b/p at home. Blood pressures have been normal and he has stopped taking blood pressure medications. Cardiac symptoms none. Patient denies chest pain, dyspnea, fatigue, and near-syncope.  Cardiovascular risk factors: advanced age (older than 3 for men, 9 for women) and male gender. Use of agents associated with hypertension: none. History of target organ damage: none. States that he has not taken his B/P medications today.    Diabetes Mellitus: Patient presents for follow up of diabetes. Symptoms: none. Denies any symptoms. Evaluation to date has been included: hemoglobin A1C.  Home sugars: BGs have been labile ranging between 100-150. Treatment to date: no recent interventions. Patient states that he is no longer taking insulin . He states that his blood sugars were dropping too low. Currently on metformin  500 mg am and pm. Currently only taking once daily. A1C today is 6.6.    Denies any other concerns today. Denies any fatigue, chest pain, shortness of breath or HA. Denies any blurred vision, numbness or tingling.    No Known Allergies  Immunization History  Administered Date(s) Administered   Influenza,inj,Quad PF,6+ Mos 09/01/2015, 09/29/2016, 03/31/2018   Moderna SARS-COV2 Booster Vaccination 11/26/2020   Moderna Sars-Covid-2 Vaccination 05/27/2020, 06/24/2020   Pneumococcal Polysaccharide-23 09/29/2016   Tdap 02/18/2015, 09/01/2015    Tobacco History: Social History   Tobacco Use  Smoking Status Former   Current packs/day:  0.00   Types: Cigarettes   Quit date: 07/17/2012   Years since quitting: 11.7  Smokeless Tobacco Never   Counseling given: Not Answered   Outpatient Encounter Medications as of 04/06/2024  Medication Sig   Blood Glucose Monitoring Suppl (TRUE METRIX METER) w/Device KIT use as directed 4 (four) times daily -  with meals and at bedtime.   glucose blood (TRUE METRIX BLOOD GLUCOSE TEST) test strip Use as instructed   Insulin  Pen Needle 31G X 8 MM MISC use to inject insulin  4 (four) times daily.   Insulin  Syringe-Needle U-100 (TRUEPLUS INSULIN  SYRINGE) 31G X 5/16 0.3 ML MISC USE AS DIRECTED 4 TIMES DAILY WITH MEALS AND AT BEDTIME   TRUEplus Lancets 28G MISC Use as instructed   [DISCONTINUED] insulin  aspart (NOVOLOG  FLEXPEN) 100 UNIT/ML FlexPen Inject 6 Units into the skin 3 (three) times daily with meals.If blood sugar is < 70...SABRASABRAHold insulin    [DISCONTINUED] insulin  glargine (LANTUS  SOLOSTAR) 100 UNIT/ML Solostar Pen Inject 30 Units into the skin at bedtime.   [DISCONTINUED] insulin  lispro (HUMALOG  KWIKPEN) 100 UNIT/ML KwikPen Inject 6 Units into the skin 3 (three) times daily with meals. If blood sugar is <70 Hold insulin    [DISCONTINUED] metFORMIN  (GLUCOPHAGE -XR) 500 MG 24 hr tablet Take 1 tablet (500 mg total) by mouth 2 (two) times daily with a meal.   insulin  aspart (NOVOLOG  FLEXPEN) 100 UNIT/ML FlexPen Inject 6 Units into the skin 3 (three) times daily with meals.If blood sugar is < 70...SABRASABRAHold insulin    insulin  glargine (LANTUS  SOLOSTAR) 100 UNIT/ML Solostar Pen Inject 30 Units into the skin at bedtime.   insulin  lispro (HUMALOG  KWIKPEN)  100 UNIT/ML KwikPen Inject 6 Units into the skin 3 (three) times daily with meals. If blood sugar is <70 Hold insulin    lisinopril  (ZESTRIL ) 10 MG tablet Take 1 tablet (10 mg total) by mouth daily. (Patient not taking: Reported on 11/30/2023)   metFORMIN  (GLUCOPHAGE -XR) 500 MG 24 hr tablet Take 1 tablet (500 mg total) by mouth 2 (two) times daily with a  meal.   mirtazapine  (REMERON ) 30 MG tablet Take 1 tablet (30 mg total) by mouth at bedtime. (Patient not taking: Reported on 11/30/2023)   Multiple Vitamin (MULTIVITAMIN WITH MINERALS) TABS tablet Take 1 tablet by mouth daily. (Patient not taking: Reported on 08/31/2023)   No facility-administered encounter medications on file as of 04/06/2024.    Review of Systems  Review of Systems  Constitutional: Negative.   HENT: Negative.    Cardiovascular: Negative.   Gastrointestinal: Negative.   Allergic/Immunologic: Negative.   Neurological: Negative.   Psychiatric/Behavioral: Negative.       Objective:   BP 118/67   Pulse (!) 59   Temp 98.2 F (36.8 C) (Oral)   Wt 114 lb 9.6 oz (52 kg)   SpO2 100%   BMI 19.67 kg/m   Wt Readings from Last 5 Encounters:  04/06/24 114 lb 9.6 oz (52 kg)  11/30/23 117 lb 9.6 oz (53.3 kg)  08/31/23 122 lb (55.3 kg)  04/06/23 117 lb 9.6 oz (53.3 kg)  02/04/23 114 lb (51.7 kg)     Physical Exam Vitals and nursing note reviewed.  Constitutional:      General: He is not in acute distress.    Appearance: He is well-developed.  Cardiovascular:     Rate and Rhythm: Normal rate and regular rhythm.  Pulmonary:     Effort: Pulmonary effort is normal.     Breath sounds: Normal breath sounds.  Skin:    General: Skin is warm and dry.  Neurological:     Mental Status: He is alert and oriented to person, place, and time.       Assessment & Plan:   Type 2 diabetes mellitus with hyperglycemia, with long-term current use of insulin  (HCC) -     Microalbumin / creatinine urine ratio -     POCT glycosylated hemoglobin (Hb A1C) -     NovoLOG  FlexPen; Inject 6 Units into the skin 3 (three) times daily with meals.If blood sugar is < 70...SABRASABRAHold insulin   Dispense: 15 mL; Refill: 11 -     Lantus  SoloStar; Inject 30 Units into the skin at bedtime.  Dispense: 15 mL; Refill: 2 -     metFORMIN  HCl ER; Take 1 tablet (500 mg total) by mouth 2 (two) times daily with  a meal.  Dispense: 60 tablet; Refill: 2  Type 2 diabetes mellitus without complication, without long-term current use of insulin  (HCC) -     Lantus  SoloStar; Inject 30 Units into the skin at bedtime.  Dispense: 15 mL; Refill: 2  Primary hypertension  Other orders -     Insulin  Lispro (1 Unit Dial ); Inject 6 Units into the skin 3 (three) times daily with meals. If blood sugar is <70 Hold insulin   Dispense: 15 mL; Refill: 12     Return in about 3 months (around 07/06/2024).     Bascom GORMAN Borer, NP 04/06/2024

## 2024-04-08 LAB — MICROALBUMIN / CREATININE URINE RATIO
Creatinine, Urine: 238.2 mg/dL
Microalb/Creat Ratio: 345 mg/g{creat} — AB (ref 0–29)
Microalbumin, Urine: 822.8 ug/mL

## 2024-04-11 ENCOUNTER — Ambulatory Visit: Payer: Self-pay | Admitting: Nurse Practitioner

## 2024-04-11 DIAGNOSIS — N289 Disorder of kidney and ureter, unspecified: Secondary | ICD-10-CM

## 2024-05-31 ENCOUNTER — Other Ambulatory Visit: Payer: Self-pay

## 2024-05-31 ENCOUNTER — Other Ambulatory Visit: Payer: Self-pay | Admitting: Nurse Practitioner

## 2024-05-31 DIAGNOSIS — R634 Abnormal weight loss: Secondary | ICD-10-CM

## 2024-05-31 MED ORDER — MIRTAZAPINE 30 MG PO TABS
30.0000 mg | ORAL_TABLET | Freq: Every day | ORAL | 2 refills | Status: DC
  Filled 2024-05-31: qty 30, 30d supply, fill #0

## 2024-05-31 MED ORDER — TRUEPLUS 5-BEVEL PEN NEEDLES 31G X 8 MM MISC
1.0000 | Freq: Four times a day (QID) | 3 refills | Status: DC
  Filled 2024-05-31: qty 100, 25d supply, fill #0

## 2024-05-31 NOTE — Telephone Encounter (Signed)
 Please advise [facility]

## 2024-06-07 ENCOUNTER — Other Ambulatory Visit: Payer: Self-pay

## 2024-06-12 ENCOUNTER — Other Ambulatory Visit: Payer: Self-pay

## 2024-07-06 ENCOUNTER — Ambulatory Visit: Payer: Self-pay | Admitting: Nurse Practitioner

## 2024-07-06 ENCOUNTER — Encounter: Payer: Self-pay | Admitting: Nurse Practitioner

## 2024-07-06 VITALS — BP 168/67 | HR 56 | Wt 125.6 lb

## 2024-07-06 DIAGNOSIS — E1165 Type 2 diabetes mellitus with hyperglycemia: Secondary | ICD-10-CM

## 2024-07-06 DIAGNOSIS — I10 Essential (primary) hypertension: Secondary | ICD-10-CM

## 2024-07-06 LAB — POCT GLYCOSYLATED HEMOGLOBIN (HGB A1C): Hemoglobin A1C: 6.5 % — AB (ref 4.0–5.6)

## 2024-07-06 MED ORDER — CLONIDINE HCL 0.1 MG PO TABS
0.1000 mg | ORAL_TABLET | Freq: Once | ORAL | Status: AC
  Administered 2024-07-06: 0.1 mg via ORAL

## 2024-07-06 MED ORDER — CLONIDINE HCL 0.1 MG PO TABS: 0.1000 mg | ORAL_TABLET | Freq: Once | ORAL | Status: DC

## 2024-07-06 NOTE — Patient Instructions (Signed)
 [facility] [address] [place], [place] [address] Phone: [phone]

## 2024-07-06 NOTE — Progress Notes (Signed)
 Subjective   Patient ID: Kevin Hubbard, male    DOB: 27-Apr-1961, 63 y.o.   MRN: [id number]  Chief Complaint  Patient presents with   Hypertension    Referring provider: Oley Bascom RAMAN, NP  Leroi Haque is a 63 y.o. male with Past Medical History: No date: Diabetes mellitus without complication (HCC)   HPI  Hypertension: Patient here for follow-up of elevated blood pressure. He is exercising and is adherent to low salt diet.  Patient does not check b/p at home. Blood pressures have been normal and he has stopped taking blood pressure medications. Cardiac symptoms none. Patient denies chest pain, dyspnea, fatigue, and near-syncope.  Cardiovascular risk factors: advanced age (older than 50 for men, 66 for women) and male gender. Use of agents associated with hypertension: none. History of target organ damage: none. States that he has not taken his B/P medications today.    Diabetes Mellitus: Patient presents for follow up of diabetes. Symptoms: none. Denies any symptoms. Evaluation to date has been included: hemoglobin A1C.  Home sugars: BGs have been labile ranging between 120 - 180. Treatment to date: no recent interventions. Patient states that he is no longer taking insulin . He states that his blood sugars were dropping too low. Currently on metformin  500 mg am and pm. Currently only taking once daily. A1C today is 6.5.  Note: microalbumin was elevated. A referral has been placed to nephrology.  Blood pressure was elevated in office. Patient did not take medication this morning. Clonidine  given in office - patient will take medications when he returns home today.     Denies any other concerns today. Denies any fatigue, chest pain, shortness of breath or HA. Denies any blurred vision, numbness or tingling.   No Known Allergies  Immunization History  Administered Date(s) Administered   Influenza,inj,Quad PF,6+ Mos 09/01/2015, 09/29/2016, 03/31/2018   Moderna SARS-COV2 Booster  Vaccination 11/26/2020   Moderna Sars-Covid-2 Vaccination 05/27/2020, 06/24/2020   Pneumococcal Polysaccharide-23 09/29/2016   Tdap 02/18/2015, 09/01/2015    Tobacco History: Social History   Tobacco Use  Smoking Status Former   Current packs/day: 0.00   Types: Cigarettes   Quit date: 07/17/2012   Years since quitting: 11.9  Smokeless Tobacco Never   Counseling given: Not Answered   Outpatient Encounter Medications as of 07/06/2024  Medication Sig   Blood Glucose Monitoring Suppl (TRUE METRIX METER) w/Device KIT use as directed 4 (four) times daily -  with meals and at bedtime.   glucose blood (TRUE METRIX BLOOD GLUCOSE TEST) test strip Use as instructed   insulin  aspart (NOVOLOG  FLEXPEN) 100 UNIT/ML FlexPen Inject 6 Units into the skin 3 (three) times daily with meals.If blood sugar is < 70...SABRASABRAHold insulin    insulin  glargine (LANTUS  SOLOSTAR) 100 UNIT/ML Solostar Pen Inject 30 Units into the skin at bedtime.   insulin  lispro (HUMALOG  KWIKPEN) 100 UNIT/ML KwikPen Inject 6 Units into the skin 3 (three) times daily with meals. If blood sugar is <70 Hold insulin    Insulin  Pen Needle (TRUEPLUS 5-BEVEL PEN NEEDLES) 31G X 8 MM MISC use to inject insulin  4 (four) times daily.   Insulin  Syringe-Needle U-100 (TRUEPLUS INSULIN  SYRINGE) 31G X 5/16 0.3 ML MISC USE AS DIRECTED 4 TIMES DAILY WITH MEALS AND AT BEDTIME   lisinopril  (ZESTRIL ) 10 MG tablet Take 1 tablet (10 mg total) by mouth daily. (Patient not taking: Reported on 11/30/2023)   metFORMIN  (GLUCOPHAGE -XR) 500 MG 24 hr tablet Take 1 tablet (500 mg total) by mouth 2 (two) times  daily with a meal.   mirtazapine  (REMERON ) 30 MG tablet Take 1 tablet (30 mg total) by mouth at bedtime.   Multiple Vitamin (MULTIVITAMIN WITH MINERALS) TABS tablet Take 1 tablet by mouth daily. (Patient not taking: Reported on 08/31/2023)   TRUEplus Lancets 28G MISC Use as instructed   [EXPIRED] cloNIDine  (CATAPRES ) tablet 0.1 mg    [DISCONTINUED] cloNIDine   (CATAPRES ) tablet 0.1 mg    No facility-administered encounter medications on file as of 07/06/2024.    Review of Systems  Review of Systems  Constitutional: Negative.   HENT: Negative.    Cardiovascular: Negative.   Gastrointestinal: Negative.   Allergic/Immunologic: Negative.   Neurological: Negative.   Psychiatric/Behavioral: Negative.       Objective:   BP (!) 168/67 (BP Location: Left Arm, Patient Position: Sitting)   Pulse (!) 56   Wt 125 lb 9.6 oz (57 kg)   SpO2 100%   BMI 21.56 kg/m   Wt Readings from Last 5 Encounters:  07/06/24 125 lb 9.6 oz (57 kg)  04/06/24 114 lb 9.6 oz (52 kg)  11/30/23 117 lb 9.6 oz (53.3 kg)  08/31/23 122 lb (55.3 kg)  04/06/23 117 lb 9.6 oz (53.3 kg)     Physical Exam Vitals and nursing note reviewed.  Constitutional:      General: He is not in acute distress.    Appearance: He is well-developed.  Cardiovascular:     Rate and Rhythm: Normal rate and regular rhythm.  Pulmonary:     Effort: Pulmonary effort is normal.     Breath sounds: Normal breath sounds.  Skin:    General: Skin is warm and dry.  Neurological:     Mental Status: He is alert and oriented to person, place, and time.       Assessment & Plan:   Primary hypertension -     cloNIDine  HCl  Type 2 diabetes mellitus with hyperglycemia, with long-term current use of insulin  (HCC) -     POCT glycosylated hemoglobin (Hb A1C) -     AMB Referral [facility] Care Management     Return in about 3 months (around 10/04/2024) for Physical.   Bascom GORMAN Borer, NP 07/06/2024

## 2024-07-23 ENCOUNTER — Telehealth: Payer: Self-pay

## 2024-07-23 NOTE — Progress Notes (Unsigned)
 Care Guide Pharmacy Note  07/23/2024 Name: Tycho Cheramie MRN: [id number] DOB: 05/10/1961  Referred By: Oley Bascom RAMAN, NP Reason for referral: Complex Care Management and Call Attempt #1 (Unsuccessful initial outreach to schedule with PHARM D- [place])   Tramel Westbrook is a 63 y.o. year old male who is a primary care patient of Oley Bascom RAMAN, NP.  Tayvien Kane was referred to the pharmacist for assistance related to: DMII  An unsuccessful telephone outreach was attempted today to contact the patient who was referred to the pharmacy team for assistance with Disease management. Additional attempts will be made to contact the patient.  Leotis Rase [facility], [facility] Guide  Direct Dial : [phone]  Fax [phone]

## 2024-07-24 NOTE — Progress Notes (Signed)
 Care Guide Pharmacy Note  07/24/2024 Name: Ryden Wainer MRN: [id number] DOB: September 21, 1960  Referred By: Oley Bascom RAMAN, NP Reason for referral: Complex Care Management, Call Attempt #1 (Unsuccessful initial outreach to schedule with PHARM DGLENWOOD Bun), and Call Attempt #2 (Unsuccessful initial outreach to schedule with PHARM D- [place]/)   Isaish Alemu is a 63 y.o. year old male who is a primary care patient of Oley Bascom RAMAN, NP.  Hoy Fallert was referred to the pharmacist for assistance related to: DMII  Successful contact was made with the patient to discuss pharmacy services including being ready for the pharmacist to call at least 5 minutes before the scheduled appointment time and to have medication bottles and any blood pressure readings ready for review. The patient agreed to meet with the pharmacist via In office on (date/time). 09/05/24 @ 9 AM.  Leotis Cl[facility]  [facility], [facility] Guide  Direct Dial : [phone]  Fax [phone]

## 2024-09-05 ENCOUNTER — Ambulatory Visit: Payer: Self-pay

## 2024-09-05 ENCOUNTER — Other Ambulatory Visit: Payer: Self-pay

## 2024-09-05 VITALS — BP 132/69 | HR 65

## 2024-09-05 DIAGNOSIS — Z794 Long term (current) use of insulin: Secondary | ICD-10-CM

## 2024-09-05 DIAGNOSIS — E785 Hyperlipidemia, unspecified: Secondary | ICD-10-CM

## 2024-09-05 DIAGNOSIS — I10 Essential (primary) hypertension: Secondary | ICD-10-CM

## 2024-09-05 DIAGNOSIS — E1165 Type 2 diabetes mellitus with hyperglycemia: Secondary | ICD-10-CM

## 2024-09-05 MED ORDER — BLOOD PRESSURE MONITOR AUTOMAT DEVI
0 refills | Status: AC
  Filled 2024-09-05: qty 1, fill #0

## 2024-09-05 MED ORDER — LOSARTAN POTASSIUM 25 MG PO TABS
12.5000 mg | ORAL_TABLET | Freq: Every day | ORAL | 3 refills | Status: AC
  Filled 2024-09-05: qty 45, 90d supply, fill #0

## 2024-09-05 MED ORDER — METFORMIN HCL ER 500 MG PO TB24
500.0000 mg | ORAL_TABLET | Freq: Two times a day (BID) | ORAL | 2 refills | Status: AC
  Filled 2024-09-05: qty 60, 30d supply, fill #0

## 2024-09-05 MED ORDER — ATORVASTATIN CALCIUM 20 MG PO TABS
20.0000 mg | ORAL_TABLET | Freq: Every day | ORAL | 3 refills | Status: AC
  Filled 2024-09-05: qty 90, 90d supply, fill #0

## 2024-09-05 NOTE — Progress Notes (Signed)
 "  09/05/2024 Name: Kevin Hubbard MRN: [id number] DOB: [birth year]  Chief Complaint  Patient presents with   Hypertension    Kevin Hubbard is a 64 y.o. year old male who was referred for medication management by their primary care provider, Oley Bascom RAMAN, NP. They presented for a face to face visit today. [name] ID [phone].    They were referred to the pharmacist by their PCP for assistance in managing hypertension . PMH includes T2DM, HTN.    Subjective: Patient was last seen by PCP, Bascom Oley, NP, on 07/06/24. At last visit, BP was initially 193/59 mmHg. He was given clonidine  0.1 mg x1. BP prior to leaving was 168/67 mmHg, HR 56 bpm. He reported home BP were at goal and he stopped taking medications. Patient reported that he stopped taking insulin , as BG had been controlled. He had continued metformin  500 mg once daily. A1C was 6.5%.  UACR on 04/06/24 was 345 mg/g (increased from 159 mg/g last year). Referral was placed to nephrology.  Today, patient presents in  good spirits and presents without  any assistance. He reports the only medication he is taking is metformin  XR 500 mg BID, but he does not take it when his BG are normal. He has not scheduled with nephrology (lost their #), but he would like to make an appt. Notes that there is a lot of foam in his urine.    Care Team: Primary Care Provider: Oley Bascom RAMAN, NP ; Next Scheduled Visit: 10/04/24   Medication Access/Adherence  Current Pharmacy:  [facility] [address], [address] [place] [address] Phone: [phone] Fax: [phone]   Patient reports affordability concerns with their medications: No  - using [facility] at [facility] pharmacy Patient reports access/transportation concerns to their pharmacy: No  Patient reports adherence concerns with their medications:  Yes  - inconsistently taking metformin    Hypertension:  Current medications: none -  reports he has a BP medication that he takes occasionally but cannot recall the name or mg strength of the medication Medications previously tried: lisinopril  10 mg  Patient does not have a validated, automated, upper arm home BP cuff  Patient denies hypotensive s/sx including dizziness, lightheadedness.  Patient denies hypertensive symptoms including headache, chest pain, shortness of breath. Has stable blurred vision.  Current meal patterns:  - Reports he does not add salt to foods at home.  - Reports that he does eat out frequently (twice/day) - has not eaten this morning - Reports ~1 bottle of water/week.   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   Reports that he smokes, but does not inhale the smoke. Currently smoking about 5 cigarettes per day. Feels he could quit if he desires.  Hyperlipidemia/ASCVD Risk Reduction  Current lipid lowering medications: none Medications tried in the past: atorvastatin  20 mg   Antiplatelet regimen: none  ASCVD History: none Family History: none reported  Clinical ASCVD: No  The 10-year ASCVD risk score (Arnett DK, et al., 2019) is: 30.7%   Values used to calculate the score:     Age: 13 years     Clinically relevant sex: Male     Is Non-Hispanic African American: No     Diabetic: Yes     Tobacco smoker: Yes     Systolic Blood Pressure: 132 mmHg     Is BP treated: Yes     HDL Cholesterol:  60 mg/dL     Total Cholesterol: 171 mg/dL    Diabetes:  Current medications: metformin  XR 500 mg BID (last filled 05/31/24 for 30ds, states he still has half a bottle left, reports he skips dose when BG is normal)   Current glucose readings:  Recalls FBG usually < 130 mg/dL  Patient denies hypoglycemic s/sx including dizziness, shakiness, sweating. Patient denies hyperglycemic symptoms including polyuria, polydipsia, polyphagia, nocturia, neuropathy. Has stable blurred vision (especially on R  eye).   Objective:  BP Readings from Last 3 Encounters:  09/05/24 132/69  07/06/24 (!) 168/67  04/06/24 118/67    Lab Results  Component Value Date   HGBA1C 6.5 (A) 07/06/2024   HGBA1C 6.6 (A) 04/06/2024   HGBA1C 7.4 (A) 11/30/2023       Latest Ref Rng & Units 11/30/2023    1:51 PM 08/31/2023   11:38 AM 02/04/2023    2:14 PM  BMP  Glucose 70 - 99 mg/dL 840  94  86   BUN 8 - 27 mg/dL 20  18  15    Creatinine 0.76 - 1.27 mg/dL 8.56  8.81  8.85   BUN/Creat Ratio 10 - 24 14  15  13    Sodium 134 - 144 mmol/L 142  140  144   Potassium 3.5 - 5.2 mmol/L 4.8  4.8  4.4   Chloride 96 - 106 mmol/L 106  105  107   CO2 20 - 29 mmol/L 23  20  24    Calcium  8.6 - 10.2 mg/dL 9.4  9.4  9.4     Lab Results  Component Value Date   CHOL 171 11/30/2023   HDL 60 11/30/2023   LDLCALC 97 11/30/2023   TRIG 73 11/30/2023   CHOLHDL 2.9 11/30/2023    Medications Reviewed Today     Reviewed by Brinda [place] SQUIBB, RPH-CPP (Pharmacist) on 09/05/24 at 1325  Med List Status: <None>   Medication Order Taking? Sig Documenting Provider Last Dose Status Informant  atorvastatin  (LIPITOR) 20 MG tablet [id number] Yes Take 1 tablet (20 mg total) by mouth daily. Oley Bascom RAMAN, NP  Active   Blood Glucose Monitoring Suppl (TRUE METRIX METER) w/Device KIT [id number]  use as directed 4 (four) times daily -  with meals and at bedtime. Shannan Sia I, NP  Active   Blood Pressure Monitoring (BLOOD PRESSURE MONITOR AUTOMAT) DEVI [id number] Yes Use as directed to monitor blood pressure Oley Bascom RAMAN, NP  Active   glucose blood (TRUE METRIX BLOOD GLUCOSE TEST) test strip [id number]  Use as instructed Oley Bascom RAMAN, NP  Active     Discontinued 09/05/24 [phone] (Patient has not taken in last 30 days)     Discontinued 09/05/24 0909 (Patient has not taken in last 30 days)     Discontinued 09/05/24 0909 (Patient has not taken in last 30 days)     Discontinued 09/05/24 0909 (No longer needed (for PRN medications))      Discontinued 09/05/24 0909 (No longer needed (for PRN medications))    Patient not taking:   Discontinued 09/05/24 0905 (Patient has not taken in last 30 days)   losartan  (COZAAR ) 25 MG tablet [id number] Yes Take 0.5 tablets (12.5 mg total) by mouth daily. Oley Bascom RAMAN, NP  Active   metFORMIN  (GLUCOPHAGE -XR) 500 MG 24 hr tablet [id number] Yes Take 1 tablet (500 mg total) by mouth 2 (two) times daily with a meal. Oley Bascom RAMAN, NP  Active    Patient not taking:   Discontinued 09/05/24  1325 (Patient has not taken in last 30 days)    Patient not taking:   Discontinued 09/05/24 0910 (Patient has not taken in last 30 days) TRUEplus Lancets 28G MISC [id number]  Use as instructed Shannan Sia I, NP  Active               Assessment/Plan:   Hypertension: - Currently uncontrolled with clinic BP slightly above goal less than 130/80. Patient is not having s/sx of hypo- or hyper-tension. Will initiate ARB given concomitant macroalbuminuria. Encouraged follow-up with nephrology.  - Reviewed long term cardiovascular and renal outcomes of uncontrolled blood pressure - Reviewed lifestyle interventions to control blood pressure. Advised to quit smoking. Patient expressed understanding and stated he would quit.  - Reviewed appropriate blood pressure monitoring technique and reviewed goal blood pressure. Recommended to check home blood pressure and heart rate once daily and keep a log to bring to upcoming appointments. Provided Rx to get OTC BP cuff at [facility] pharmacy. - Recommend to START losartan  12.5 mg daily    Hyperlipidemia/ASCVD Risk Reduction: - Currently uncontrolled with most recent LDL-C of 97 mg/dL above goal < 70 mg/dL given U7IF, CKD, HTN. Moderate intensity statin indicated. - Reviewed long term complications of uncontrolled cholesterol - Recommend to START atorvastatin  20 mg daily    Diabetes: - Currently controlled with most recent A1C of 6.5% below goal <7%. Encouraged daily  adherence to metformin .  - Last UACR 345 mg/g - Reviewed long term cardiovascular and renal outcomes of uncontrolled blood sugar - Reviewed goal A1c, goal fasting, and goal 2 hour post prandial glucose - Recommend to continue metformin  XR 500 mg BID. Stated he could take 2 tabs once daily if this would improve adherence.  - Recommend to check glucose twice daily: fasting and 2-hr PPG a couple times per week. - Next A1C due March 2026     Written patient instructions provided. Patient verbalized understanding of treatment plan. Provided with phone number for [facility].  Follow Up Plan:  Pharmacist telephone 10/17/24 PCP clinic visit in 10/04/24   Lorain Baseman, PharmD [facility] [phone]   "

## 2024-09-05 NOTE — Patient Instructions (Addendum)
¡  Fue un placer verte hoy!  Tu objetivo de glucosa en sangre es de 80-130 mg/dL antes de comer y menos de 180 mg/dL despus de comer. Controla tus niveles de glucosa en casa y lleva un registro (con un glucmetro o en una hoja de papel) para traerlo a tu prxima cita.  Tu objetivo de presin arterial es inferior a 130/80 mmHg.  Cambios en la medicacin: Comenzar con losartn 12.5 mg (1/2 comprimido) carmax.  Comenzar con atorvastatina 20 mg al da.  Continuar con metformina XR 500 mg consolidated edison. Puedes tomar ambos comprimidos a la vez si te resulta ms fcil recordarlo.  Lorain Baseman, PharmD [facility] [phone]  _________________________________________________________________________   It was nice to see you today!  Your goal blood sugar is 80-130 mg/dL before eating and less than 180 mg/dL after eating. Monitor blood sugars at home and keep a log (glucometer or piece of paper) to bring with you to your next visit.  Your goal blood pressure is less than 130/80 mmHg.  Medication Changes: START losartan  12.5 mg (1/2 tablet) every day.  START atorvastatin  20 mg daily  Continue metformin  XR 500 mg TWICE daily. You can take both tablets at once if it is easier to remember.  Lorain Baseman, PharmD [facility] [phone]

## 2024-10-04 ENCOUNTER — Encounter: Payer: Self-pay | Admitting: Nurse Practitioner

## 2024-10-17 ENCOUNTER — Other Ambulatory Visit: Payer: Self-pay
